# Patient Record
Sex: Female | Born: 1940 | Race: White | Hispanic: No | Marital: Married | Smoking: Never smoker
Health system: Southern US, Community
[De-identification: ages and names within clinical notes are randomized; demographics above are authoritative.]

## PROBLEM LIST (undated history)

## (undated) DIAGNOSIS — M7061 Trochanteric bursitis, right hip: Secondary | ICD-10-CM

## (undated) DIAGNOSIS — M51369 Other intervertebral disc degeneration, lumbar region without mention of lumbar back pain or lower extremity pain: Secondary | ICD-10-CM

## (undated) DIAGNOSIS — G894 Chronic pain syndrome: Secondary | ICD-10-CM

## (undated) DIAGNOSIS — I10 Essential (primary) hypertension: Secondary | ICD-10-CM

## (undated) DIAGNOSIS — M48061 Spinal stenosis, lumbar region without neurogenic claudication: Secondary | ICD-10-CM

## (undated) DIAGNOSIS — M7062 Trochanteric bursitis, left hip: Secondary | ICD-10-CM

## (undated) DIAGNOSIS — M5136 Other intervertebral disc degeneration, lumbar region: Secondary | ICD-10-CM

## (undated) DIAGNOSIS — M47819 Spondylosis without myelopathy or radiculopathy, site unspecified: Secondary | ICD-10-CM

## (undated) DIAGNOSIS — K5090 Crohn's disease, unspecified, without complications: Secondary | ICD-10-CM

## (undated) DIAGNOSIS — M1990 Unspecified osteoarthritis, unspecified site: Secondary | ICD-10-CM

## (undated) HISTORY — DX: Trochanteric bursitis, right hip: M70.61

## (undated) HISTORY — DX: Spondylosis without myelopathy or radiculopathy, site unspecified: M47.819

## (undated) HISTORY — DX: Unspecified osteoarthritis, unspecified site: M19.90

## (undated) HISTORY — DX: Chronic pain syndrome: G89.4

## (undated) HISTORY — DX: Trochanteric bursitis, right hip: M70.62

## (undated) HISTORY — DX: Other intervertebral disc degeneration, lumbar region without mention of lumbar back pain or lower extremity pain: M51.369

## (undated) HISTORY — DX: Other intervertebral disc degeneration, lumbar region: M51.36

## (undated) HISTORY — DX: Spinal stenosis, lumbar region without neurogenic claudication: M48.061

---

## 2020-09-11 ENCOUNTER — Encounter (HOSPITAL_COMMUNITY): Payer: Self-pay | Admitting: Emergency Medicine

## 2020-09-11 ENCOUNTER — Inpatient Hospital Stay (HOSPITAL_COMMUNITY)
Admission: EM | Admit: 2020-09-11 | Discharge: 2020-09-13 | DRG: 690 | Disposition: A | Discharge status: Home / Self Care | Payer: Medicare Other | Attending: Internal Medicine | Admitting: Internal Medicine

## 2020-09-11 DIAGNOSIS — F411 Generalized anxiety disorder: Secondary | ICD-10-CM | POA: Diagnosis present

## 2020-09-11 DIAGNOSIS — N3000 Acute cystitis without hematuria: Principal | ICD-10-CM | POA: Diagnosis present

## 2020-09-11 DIAGNOSIS — R112 Nausea with vomiting, unspecified: Secondary | ICD-10-CM | POA: Diagnosis present

## 2020-09-11 DIAGNOSIS — E039 Hypothyroidism, unspecified: Secondary | ICD-10-CM | POA: Diagnosis present

## 2020-09-11 DIAGNOSIS — E871 Hypo-osmolality and hyponatremia: Secondary | ICD-10-CM | POA: Diagnosis present

## 2020-09-11 DIAGNOSIS — K219 Gastro-esophageal reflux disease without esophagitis: Secondary | ICD-10-CM | POA: Diagnosis present

## 2020-09-11 DIAGNOSIS — G894 Chronic pain syndrome: Secondary | ICD-10-CM | POA: Diagnosis present

## 2020-09-11 DIAGNOSIS — Z79899 Other long term (current) drug therapy: Secondary | ICD-10-CM

## 2020-09-11 DIAGNOSIS — K5900 Constipation, unspecified: Secondary | ICD-10-CM | POA: Diagnosis present

## 2020-09-11 DIAGNOSIS — I10 Essential (primary) hypertension: Secondary | ICD-10-CM | POA: Diagnosis present

## 2020-09-11 DIAGNOSIS — E785 Hyperlipidemia, unspecified: Secondary | ICD-10-CM | POA: Diagnosis present

## 2020-09-11 DIAGNOSIS — M4800 Spinal stenosis, site unspecified: Secondary | ICD-10-CM | POA: Diagnosis present

## 2020-09-11 DIAGNOSIS — Z90710 Acquired absence of both cervix and uterus: Secondary | ICD-10-CM

## 2020-09-11 DIAGNOSIS — Z882 Allergy status to sulfonamides status: Secondary | ICD-10-CM

## 2020-09-11 DIAGNOSIS — E861 Hypovolemia: Secondary | ICD-10-CM | POA: Diagnosis present

## 2020-09-11 DIAGNOSIS — I4510 Unspecified right bundle-branch block: Secondary | ICD-10-CM | POA: Diagnosis present

## 2020-09-11 DIAGNOSIS — R109 Unspecified abdominal pain: Secondary | ICD-10-CM | POA: Diagnosis present

## 2020-09-11 DIAGNOSIS — R531 Weakness: Secondary | ICD-10-CM

## 2020-09-11 DIAGNOSIS — N390 Urinary tract infection, site not specified: Secondary | ICD-10-CM

## 2020-09-11 DIAGNOSIS — Z79890 Hormone replacement therapy (postmenopausal): Secondary | ICD-10-CM

## 2020-09-11 DIAGNOSIS — Z880 Allergy status to penicillin: Secondary | ICD-10-CM

## 2020-09-11 DIAGNOSIS — E860 Dehydration: Secondary | ICD-10-CM | POA: Diagnosis present

## 2020-09-11 DIAGNOSIS — R9431 Abnormal electrocardiogram [ECG] [EKG]: Secondary | ICD-10-CM

## 2020-09-11 DIAGNOSIS — Z20822 Contact with and (suspected) exposure to covid-19: Secondary | ICD-10-CM | POA: Diagnosis present

## 2020-09-11 HISTORY — DX: Essential (primary) hypertension: I10

## 2020-09-11 HISTORY — DX: Crohn's disease, unspecified, without complications: K50.90

## 2020-09-11 MED ORDER — SODIUM CHLORIDE 0.9 % IV SOLN: INTRAVENOUS | Status: DC

## 2020-09-11 MED ORDER — FENTANYL CITRATE (PF) 100 MCG/2ML IJ SOLN
50.0000 ug | Freq: Once | INTRAMUSCULAR | Status: AC
Start: 2020-09-11 — End: 2020-09-11
  Administered 2020-09-11: 50 ug via INTRAVENOUS
  Filled 2020-09-11: qty 2

## 2020-09-11 MED ORDER — HYDRALAZINE HCL 20 MG/ML IJ SOLN
2.0000 mg | Freq: Once | INTRAMUSCULAR | Status: AC
  Administered 2020-09-11: 2 mg via INTRAVENOUS
  Filled 2020-09-11: qty 1

## 2020-09-11 NOTE — ED Provider Notes (Signed)
[facility]-EMERGENCY DEPT Provider Note   CSN: [id number] Arrival date & time: 09/11/20  2300     History Chief Complaint  Patient presents with  . Emesis  . Abdominal Pain  . Weakness    Kathy Fischer is a 80 y.o. female.  The history is provided by the patient and a relative.  Emesis Severity:  Moderate Duration:  2 days Timing:  Sporadic Number of daily episodes:  2 Quality:  Stomach contents Progression:  Unchanged Chronicity:  New Recent urination:  Normal Relieved by:  Nothing Worsened by:  Nothing Ineffective treatments:  None tried Associated symptoms: abdominal pain   Associated symptoms: no arthralgias, no cough, no diarrhea and no fever   Risk factors: no alcohol use   Abdominal Pain Associated symptoms: constipation, nausea and vomiting   Associated symptoms: no cough, no diarrhea and no fever   Weakness Associated symptoms: abdominal pain, nausea and vomiting   Associated symptoms: no arthralgias, no cough, no diarrhea and no fever   nausea and vomiting and weakness and cannot hold down medications. No f/c/r.        Past Medical History:  Diagnosis Date  . Crohn's disease (HCC)   . Hypertension     There are no problems to display for this patient.   History reviewed. No pertinent surgical history.   OB History   No obstetric history on file.     History reviewed. No pertinent family history.     Home Medications Prior to Admission medications   Not on File    Allergies    Penicillins and Sulfa antibiotics  Review of Systems   Review of Systems  Constitutional: Negative for fever.  HENT: Negative for congestion.   Eyes: Negative for visual disturbance.  Respiratory: Negative for cough.   Gastrointestinal: Positive for abdominal pain, constipation, nausea and vomiting. Negative for diarrhea.  Genitourinary: Negative for difficulty urinating.  Musculoskeletal: Negative for arthralgias.  Skin: Negative  for rash.  Neurological: Negative for weakness.  Psychiatric/Behavioral: Negative for agitation.  All other systems reviewed and are negative.   Physical Exam Updated Vital Signs BP (!) 223/107 Comment: rn aware  Pulse 76   Temp 97.7 F (36.5 C) (Oral)   Resp (!) 21   SpO2 100%   Physical Exam Vitals and nursing note reviewed.  Constitutional:      General: She is not in acute distress.    Appearance: Normal appearance.  HENT:     Head: Normocephalic and atraumatic.     Nose: Nose normal.  Eyes:     Conjunctiva/sclera: Conjunctivae normal.     Pupils: Pupils are equal, round, and reactive to light.  Cardiovascular:     Rate and Rhythm: Normal rate and regular rhythm.     Pulses: Normal pulses.     Heart sounds: Normal heart sounds.  Pulmonary:     Effort: Pulmonary effort is normal.     Breath sounds: Normal breath sounds.  Abdominal:     General: Abdomen is flat.     Palpations: Abdomen is soft.     Tenderness: There is abdominal tenderness. There is no guarding or rebound.  Musculoskeletal:        General: Normal range of motion.     Cervical back: Normal range of motion and neck supple.  Skin:    General: Skin is warm and dry.     Capillary Refill: Capillary refill takes less than 2 seconds.  Neurological:     General: No  focal deficit present.     Mental Status: She is alert and oriented to person, place, and time.     Deep Tendon Reflexes: Reflexes normal.  Psychiatric:        Mood and Affect: Mood normal.        Behavior: Behavior normal.     ED Results / Procedures / Treatments   Labs (all labs ordered are listed, but only abnormal results are displayed) Results for orders placed or performed during the hospital encounter of 09/11/20  CBC with Differential/Platelet  Result Value Ref Range   WBC 9.4 4.0 - 10.5 K/uL   RBC 4.14 3.87 - 5.11 MIL/uL   Hemoglobin 12.9 12.0 - 15.0 g/dL   HCT 50.5 39.7 - 67.3 %   MCV 90.8 80.0 - 100.0 fL   MCH 31.2 26.0 -  34.0 pg   MCHC 34.3 30.0 - 36.0 g/dL   RDW 41.9 37.9 - 02.4 %   Platelets 333 150 - 400 K/uL   nRBC 0.0 0.0 - 0.2 %   Neutrophils Relative % 84 %   Neutro Abs 7.9 (H) 1.7 - 7.7 K/uL   Lymphocytes Relative 12 %   Lymphs Abs 1.1 0.7 - 4.0 K/uL   Monocytes Relative 3 %   Monocytes Absolute 0.3 0.1 - 1.0 K/uL   Eosinophils Relative 0 %   Eosinophils Absolute 0.0 0.0 - 0.5 K/uL   Basophils Relative 0 %   Basophils Absolute 0.0 0.0 - 0.1 K/uL   Immature Granulocytes 1 %   Abs Immature Granulocytes 0.11 (H) 0.00 - 0.07 K/uL  Comprehensive metabolic panel  Result Value Ref Range   Sodium 126 (L) 135 - 145 mmol/L   Potassium 4.7 3.5 - 5.1 mmol/L   Chloride 97 (L) 98 - 111 mmol/L   CO2 17 (L) 22 - 32 mmol/L   Glucose, Bld 122 (H) 70 - 99 mg/dL   BUN 20 8 - 23 mg/dL   Creatinine, Ser 0.97 (H) 0.44 - 1.00 mg/dL   Calcium 9.7 8.9 - 35.3 mg/dL   Total Protein 8.5 (H) 6.5 - 8.1 g/dL   Albumin 4.5 3.5 - 5.0 g/dL   AST 24 15 - 41 U/L   ALT 15 0 - 44 U/L   Alkaline Phosphatase 89 38 - 126 U/L   Total Bilirubin 0.5 0.3 - 1.2 mg/dL   GFR, Estimated 56 (L) >60 mL/min   Anion gap 12 5 - 15  Urinalysis, Routine w reflex microscopic Urine, Catheterized  Result Value Ref Range   Color, Urine YELLOW YELLOW   APPearance CLEAR CLEAR   Specific Gravity, Urine 1.011 1.005 - 1.030   pH 6.0 5.0 - 8.0   Glucose, UA NEGATIVE NEGATIVE mg/dL   Hgb urine dipstick SMALL (A) NEGATIVE   Bilirubin Urine NEGATIVE NEGATIVE   Ketones, ur 5 (A) NEGATIVE mg/dL   Protein, ur 299 (A) NEGATIVE mg/dL   Nitrite NEGATIVE NEGATIVE   Leukocytes,Ua MODERATE (A) NEGATIVE   RBC / HPF 0-5 0 - 5 RBC/hpf   WBC, UA >50 (H) 0 - 5 WBC/hpf   Bacteria, UA FEW (A) NONE SEEN  Troponin I (High Sensitivity)  Result Value Ref Range   Troponin I (High Sensitivity) 4 <18 ng/L   DG Chest Portable 1 View  Result Date: 09/12/2020 CLINICAL DATA:  Nausea and vomiting and weakness EXAM: PORTABLE CHEST 1 VIEW COMPARISON:  None. FINDINGS:  The heart size and mediastinal contours are within normal limits. Both lungs are clear. The visualized  skeletal structures are unremarkable. IMPRESSION: No active disease. Electronically Signed   By: Alcide Clever M.D.   On: 09/12/2020 01:43   CT Renal Stone Study  Result Date: 09/12/2020 CLINICAL DATA:  Flank pain EXAM: CT ABDOMEN AND PELVIS WITHOUT CONTRAST TECHNIQUE: Multidetector CT imaging of the abdomen and pelvis was performed following the standard protocol without IV contrast. COMPARISON:  None. FINDINGS: Lower chest: No acute abnormality. Hepatobiliary: Liver demonstrates multiple cysts. The largest of these is noted centrally measuring 5.3 cm. Gallbladder is not well visualized. Question prior cholecystectomy Pancreas: Unremarkable. No pancreatic ductal dilatation or surrounding inflammatory changes. Spleen: Normal in size without focal abnormality. Adrenals/Urinary Tract: Adrenal glands are within normal limits. Kidneys are well visualized bilaterally. Extrarenal pelves are seen bilaterally without obstructive change. The ureters are within normal limits. Bladder is partially distended. Stomach/Bowel: Changes of mild colonic constipation are noted. No obstructive changes are seen. The appendix is not well visualized although no inflammatory changes are noted. Small bowel is unremarkable. Stomach demonstrates a moderate-sized sliding-type hiatal hernia. Vascular/Lymphatic: Aortic atherosclerosis. No enlarged abdominal or pelvic lymph nodes. Reproductive: Status post hysterectomy. No adnexal masses. Other: No abdominal wall hernia or abnormality. No abdominopelvic ascites. Musculoskeletal: No acute or significant osseous findings. IMPRESSION: Hepatic cysts. Mild colonic constipation. Moderate-sized sliding-type hiatal hernia. Electronically Signed   By: Alcide Clever M.D.   On: 09/12/2020 00:32    Radiology No results found.  Procedures Procedures   Medications Ordered in ED Medications  0.9 %   sodium chloride infusion ( Intravenous New Bag/Given 09/11/20 2342)  promethazine (PHENERGAN) 12.5 mg in sodium chloride 0.9 % 50 mL IVPB (has no administration in time range)  hydrALAZINE (APRESOLINE) injection 2 mg (2 mg Intravenous Given 09/11/20 2345)  fentaNYL (SUBLIMAZE) injection 50 mcg (50 mcg Intravenous Given 09/11/20 2347)  ketorolac (TORADOL) 30 MG/ML injection 30 mg (30 mg Intravenous Given 09/12/20 0107)  hydrALAZINE (APRESOLINE) injection 5 mg (5 mg Intravenous Given 09/12/20 0108)    ED Course  I have reviewed the triage vital signs and the nursing notes.  Pertinent labs & imaging results that were available during my care of the patient were reviewed by me and considered in my medical decision making (see chart for details).     Final Clinical Impression(s) / ED Diagnoses Admit to medicine    Andres Escandon, MD 09/12/20 [id number]

## 2020-09-11 NOTE — ED Triage Notes (Signed)
Pt BIB EMS from home c/o N/D x 24 hours. Sx worsened in the last 3 hours. Also clo abdominal pain and weakness. Hx of Crohn's disease.

## 2020-09-12 ENCOUNTER — Emergency Department (HOSPITAL_COMMUNITY): Payer: Medicare Other

## 2020-09-12 ENCOUNTER — Encounter (HOSPITAL_COMMUNITY): Payer: Self-pay | Admitting: Emergency Medicine

## 2020-09-12 ENCOUNTER — Other Ambulatory Visit: Payer: Self-pay

## 2020-09-12 DIAGNOSIS — K219 Gastro-esophageal reflux disease without esophagitis: Secondary | ICD-10-CM | POA: Diagnosis present

## 2020-09-12 DIAGNOSIS — E860 Dehydration: Secondary | ICD-10-CM | POA: Diagnosis present

## 2020-09-12 DIAGNOSIS — Z880 Allergy status to penicillin: Secondary | ICD-10-CM | POA: Diagnosis not present

## 2020-09-12 DIAGNOSIS — E871 Hypo-osmolality and hyponatremia: Secondary | ICD-10-CM | POA: Diagnosis present

## 2020-09-12 DIAGNOSIS — R531 Weakness: Secondary | ICD-10-CM

## 2020-09-12 DIAGNOSIS — K5900 Constipation, unspecified: Secondary | ICD-10-CM | POA: Diagnosis present

## 2020-09-12 DIAGNOSIS — N3000 Acute cystitis without hematuria: Principal | ICD-10-CM

## 2020-09-12 DIAGNOSIS — Z20822 Contact with and (suspected) exposure to covid-19: Secondary | ICD-10-CM | POA: Diagnosis present

## 2020-09-12 DIAGNOSIS — E785 Hyperlipidemia, unspecified: Secondary | ICD-10-CM | POA: Diagnosis present

## 2020-09-12 DIAGNOSIS — I10 Essential (primary) hypertension: Secondary | ICD-10-CM | POA: Diagnosis not present

## 2020-09-12 DIAGNOSIS — Z882 Allergy status to sulfonamides status: Secondary | ICD-10-CM | POA: Diagnosis not present

## 2020-09-12 DIAGNOSIS — Z79890 Hormone replacement therapy (postmenopausal): Secondary | ICD-10-CM | POA: Diagnosis not present

## 2020-09-12 DIAGNOSIS — Z79899 Other long term (current) drug therapy: Secondary | ICD-10-CM | POA: Diagnosis not present

## 2020-09-12 DIAGNOSIS — M4800 Spinal stenosis, site unspecified: Secondary | ICD-10-CM | POA: Diagnosis present

## 2020-09-12 DIAGNOSIS — F411 Generalized anxiety disorder: Secondary | ICD-10-CM | POA: Diagnosis present

## 2020-09-12 DIAGNOSIS — E861 Hypovolemia: Secondary | ICD-10-CM | POA: Diagnosis present

## 2020-09-12 DIAGNOSIS — G894 Chronic pain syndrome: Secondary | ICD-10-CM | POA: Diagnosis present

## 2020-09-12 DIAGNOSIS — I4510 Unspecified right bundle-branch block: Secondary | ICD-10-CM | POA: Diagnosis present

## 2020-09-12 DIAGNOSIS — E039 Hypothyroidism, unspecified: Secondary | ICD-10-CM | POA: Diagnosis present

## 2020-09-12 DIAGNOSIS — R112 Nausea with vomiting, unspecified: Secondary | ICD-10-CM

## 2020-09-12 DIAGNOSIS — R9431 Abnormal electrocardiogram [ECG] [EKG]: Secondary | ICD-10-CM

## 2020-09-12 DIAGNOSIS — R109 Unspecified abdominal pain: Secondary | ICD-10-CM

## 2020-09-12 DIAGNOSIS — Z90710 Acquired absence of both cervix and uterus: Secondary | ICD-10-CM | POA: Diagnosis not present

## 2020-09-12 LAB — RESP PANEL BY RT-PCR (FLU A&B, COVID) ARPGX2
Influenza A by PCR: NEGATIVE
Influenza B by PCR: NEGATIVE
SARS Coronavirus 2 by RT PCR: NEGATIVE

## 2020-09-12 LAB — CBC WITH DIFFERENTIAL/PLATELET
Abs Immature Granulocytes: 0.11 10*3/uL — ABNORMAL HIGH (ref 0.00–0.07)
Abs Immature Granulocytes: 0.06 10*3/uL (ref 0.00–0.07)
Basophils Absolute: 0 10*3/uL (ref 0.0–0.1)
Basophils Absolute: 0.1 10*3/uL (ref 0.0–0.1)
Basophils Relative: 0 %
Basophils Relative: 1 %
Eosinophils Absolute: 0 10*3/uL (ref 0.0–0.5)
Eosinophils Absolute: 0 10*3/uL (ref 0.0–0.5)
Eosinophils Relative: 0 %
Eosinophils Relative: 0 %
HCT: 37.6 % (ref 36.0–46.0)
HCT: 37.9 % (ref 36.0–46.0)
Hemoglobin: 12.9 g/dL (ref 12.0–15.0)
Hemoglobin: 12.8 g/dL (ref 12.0–15.0)
Immature Granulocytes: 1 %
Immature Granulocytes: 1 %
Lymphocytes Relative: 12 %
Lymphocytes Relative: 19 %
Lymphs Abs: 1.1 10*3/uL (ref 0.7–4.0)
Lymphs Abs: 2 10*3/uL (ref 0.7–4.0)
MCH: 31.2 pg (ref 26.0–34.0)
MCH: 31.3 pg (ref 26.0–34.0)
MCHC: 34.3 g/dL (ref 30.0–36.0)
MCHC: 33.8 g/dL (ref 30.0–36.0)
MCV: 90.8 fL (ref 80.0–100.0)
MCV: 92.7 fL (ref 80.0–100.0)
Monocytes Absolute: 0.3 10*3/uL (ref 0.1–1.0)
Monocytes Absolute: 0.7 10*3/uL (ref 0.1–1.0)
Monocytes Relative: 3 %
Monocytes Relative: 7 %
Neutro Abs: 7.9 10*3/uL — ABNORMAL HIGH (ref 1.7–7.7)
Neutro Abs: 7.6 10*3/uL (ref 1.7–7.7)
Neutrophils Relative %: 84 %
Neutrophils Relative %: 72 %
Platelets: 333 10*3/uL (ref 150–400)
Platelets: 314 10*3/uL (ref 150–400)
RBC: 4.14 MIL/uL (ref 3.87–5.11)
RBC: 4.09 MIL/uL (ref 3.87–5.11)
RDW: 12.1 % (ref 11.5–15.5)
RDW: 12.4 % (ref 11.5–15.5)
WBC: 9.4 10*3/uL (ref 4.0–10.5)
WBC: 10.4 10*3/uL (ref 4.0–10.5)
nRBC: 0 % (ref 0.0–0.2)
nRBC: 0 % (ref 0.0–0.2)

## 2020-09-12 LAB — URINALYSIS, ROUTINE W REFLEX MICROSCOPIC
Bilirubin Urine: NEGATIVE
Glucose, UA: NEGATIVE mg/dL
Ketones, ur: 5 mg/dL — AB
Nitrite: NEGATIVE
Protein, ur: 100 mg/dL — AB
Specific Gravity, Urine: 1.011 (ref 1.005–1.030)
WBC, UA: >50 WBC/hpf — ABNORMAL HIGH (ref 0–5)
pH: 6 (ref 5.0–8.0)

## 2020-09-12 LAB — SODIUM, URINE, RANDOM: Sodium, Ur: 67 mmol/L

## 2020-09-12 LAB — COMPREHENSIVE METABOLIC PANEL
ALT: 15 U/L (ref 0–44)
ALT: 17 U/L (ref 0–44)
AST: 24 U/L (ref 15–41)
AST: 27 U/L (ref 15–41)
Albumin: 4.5 g/dL (ref 3.5–5.0)
Albumin: 4 g/dL (ref 3.5–5.0)
Alkaline Phosphatase: 89 U/L (ref 38–126)
Alkaline Phosphatase: 77 U/L (ref 38–126)
Anion gap: 12 (ref 5–15)
Anion gap: 14 (ref 5–15)
BUN: 20 mg/dL (ref 8–23)
BUN: 17 mg/dL (ref 8–23)
CO2: 17 mmol/L — ABNORMAL LOW (ref 22–32)
CO2: 15 mmol/L — ABNORMAL LOW (ref 22–32)
Calcium: 9.7 mg/dL (ref 8.9–10.3)
Calcium: 9.4 mg/dL (ref 8.9–10.3)
Chloride: 97 mmol/L — ABNORMAL LOW (ref 98–111)
Chloride: 105 mmol/L (ref 98–111)
Creatinine, Ser: 1.01 mg/dL — ABNORMAL HIGH (ref 0.44–1.00)
Creatinine, Ser: 0.9 mg/dL (ref 0.44–1.00)
GFR, Estimated: 56 mL/min — ABNORMAL LOW (ref >60)
GFR, Estimated: >60 mL/min (ref >60)
Glucose, Bld: 122 mg/dL — ABNORMAL HIGH (ref 70–99)
Glucose, Bld: 107 mg/dL — ABNORMAL HIGH (ref 70–99)
Potassium: 4.7 mmol/L (ref 3.5–5.1)
Potassium: 4 mmol/L (ref 3.5–5.1)
Sodium: 126 mmol/L — ABNORMAL LOW (ref 135–145)
Sodium: 134 mmol/L — ABNORMAL LOW (ref 135–145)
Total Bilirubin: 0.5 mg/dL (ref 0.3–1.2)
Total Bilirubin: 0.8 mg/dL (ref 0.3–1.2)
Total Protein: 8.5 g/dL — ABNORMAL HIGH (ref 6.5–8.1)
Total Protein: 7.4 g/dL (ref 6.5–8.1)

## 2020-09-12 LAB — TROPONIN I (HIGH SENSITIVITY)
Troponin I (High Sensitivity): 4 ng/L (ref <18)
Troponin I (High Sensitivity): 8 ng/L (ref <18)

## 2020-09-12 LAB — OSMOLALITY: Osmolality: 279 mOsm/kg (ref 275–295)

## 2020-09-12 LAB — MAGNESIUM: Magnesium: 1.9 mg/dL (ref 1.7–2.4)

## 2020-09-12 LAB — OSMOLALITY, URINE: Osmolality, Ur: 383 mOsm/kg (ref 300–900)

## 2020-09-12 LAB — TSH: TSH: 3.575 u[IU]/mL (ref 0.350–4.500)

## 2020-09-12 MED ORDER — ENOXAPARIN SODIUM 40 MG/0.4ML ~~LOC~~ SOLN
40.0000 mg | SUBCUTANEOUS | Status: DC
  Administered 2020-09-12 – 2020-09-13 (×2): 40 mg via SUBCUTANEOUS
  Filled 2020-09-12 (×2): qty 0.4

## 2020-09-12 MED ORDER — SODIUM CHLORIDE 0.9 % IV SOLN
1.0000 g | Freq: Once | INTRAVENOUS | Status: AC
  Administered 2020-09-12: 1 g via INTRAVENOUS
  Filled 2020-09-12: qty 1

## 2020-09-12 MED ORDER — HYDRALAZINE HCL 20 MG/ML IJ SOLN
10.0000 mg | INTRAMUSCULAR | Status: DC | PRN
  Administered 2020-09-12: 10 mg via INTRAVENOUS
  Filled 2020-09-12: qty 1

## 2020-09-12 MED ORDER — LORAZEPAM 2 MG/ML IJ SOLN
0.5000 mg | INTRAMUSCULAR | Status: DC | PRN
  Administered 2020-09-12 (×2): 0.5 mg via INTRAVENOUS
  Filled 2020-09-12 (×2): qty 1

## 2020-09-12 MED ORDER — GABAPENTIN 300 MG PO CAPS
300.0000 mg | ORAL_CAPSULE | Freq: Four times a day (QID) | ORAL | Status: DC
  Administered 2020-09-12 – 2020-09-13 (×4): 300 mg via ORAL
  Filled 2020-09-12 (×4): qty 1

## 2020-09-12 MED ORDER — HYDROMORPHONE HCL 1 MG/ML IJ SOLN
1.0000 mg | INTRAMUSCULAR | Status: DC | PRN
  Administered 2020-09-12 (×2): 1 mg via INTRAVENOUS
  Filled 2020-09-12 (×2): qty 1

## 2020-09-12 MED ORDER — FENTANYL CITRATE (PF) 100 MCG/2ML IJ SOLN: 50.0000 ug | Freq: Once | INTRAMUSCULAR | Status: DC

## 2020-09-12 MED ORDER — BUPROPION HCL ER (XL) 150 MG PO TB24
150.0000 mg | ORAL_TABLET | Freq: Every day | ORAL | Status: DC
  Administered 2020-09-12 – 2020-09-13 (×2): 150 mg via ORAL
  Filled 2020-09-12 (×2): qty 1

## 2020-09-12 MED ORDER — KETOROLAC TROMETHAMINE 30 MG/ML IJ SOLN
30.0000 mg | Freq: Once | INTRAMUSCULAR | Status: AC
  Administered 2020-09-12: 30 mg via INTRAVENOUS
  Filled 2020-09-12: qty 1

## 2020-09-12 MED ORDER — LIP MEDEX EX OINT: TOPICAL_OINTMENT | CUTANEOUS | Status: DC | PRN

## 2020-09-12 MED ORDER — SODIUM CHLORIDE 0.9 % IV SOLN
1.0000 g | Freq: Once | INTRAVENOUS | Status: DC
  Filled 2020-09-12: qty 1

## 2020-09-12 MED ORDER — HYDRALAZINE HCL 20 MG/ML IJ SOLN
5.0000 mg | Freq: Once | INTRAMUSCULAR | Status: AC
  Administered 2020-09-12: 5 mg via INTRAVENOUS
  Filled 2020-09-12: qty 1

## 2020-09-12 MED ORDER — SODIUM CHLORIDE 0.9 % IV SOLN: INTRAVENOUS | Status: DC

## 2020-09-12 MED ORDER — SODIUM CHLORIDE 0.9 % IV SOLN
1.0000 g | INTRAVENOUS | Status: DC
  Administered 2020-09-12: 1 g via INTRAVENOUS
  Filled 2020-09-12: qty 1
  Filled 2020-09-12: qty 10

## 2020-09-12 MED ORDER — DULOXETINE HCL 60 MG PO CPEP
60.0000 mg | ORAL_CAPSULE | Freq: Every day | ORAL | Status: DC
  Administered 2020-09-12 – 2020-09-13 (×2): 60 mg via ORAL
  Filled 2020-09-12 (×2): qty 1

## 2020-09-12 MED ORDER — DICYCLOMINE HCL 20 MG PO TABS
20.0000 mg | ORAL_TABLET | Freq: Three times a day (TID) | ORAL | Status: DC
  Administered 2020-09-12 – 2020-09-13 (×5): 20 mg via ORAL
  Filled 2020-09-12 (×6): qty 1

## 2020-09-12 MED ORDER — SODIUM CHLORIDE 0.9 % IV SOLN
2.0000 g | Freq: Once | INTRAVENOUS | Status: DC
  Filled 2020-09-12: qty 2

## 2020-09-12 MED ORDER — LEVOTHYROXINE SODIUM 75 MCG PO TABS
75.0000 ug | ORAL_TABLET | Freq: Every day | ORAL | Status: DC
  Administered 2020-09-12 – 2020-09-13 (×2): 75 ug via ORAL
  Filled 2020-09-12 (×4): qty 1

## 2020-09-12 MED ORDER — CARISOPRODOL 350 MG PO TABS
350.0000 mg | ORAL_TABLET | Freq: Three times a day (TID) | ORAL | Status: DC
  Administered 2020-09-12 – 2020-09-13 (×3): 350 mg via ORAL
  Filled 2020-09-12 (×3): qty 1

## 2020-09-12 MED ORDER — SODIUM CHLORIDE 0.9 % IV SOLN
12.5000 mg | Freq: Four times a day (QID) | INTRAVENOUS | Status: DC | PRN
  Administered 2020-09-12: 12.5 mg via INTRAVENOUS
  Filled 2020-09-12: qty 12.5

## 2020-09-12 MED ORDER — ACETAMINOPHEN 325 MG PO TABS
650.0000 mg | ORAL_TABLET | Freq: Four times a day (QID) | ORAL | Status: DC | PRN
  Administered 2020-09-12 – 2020-09-13 (×3): 650 mg via ORAL
  Filled 2020-09-12 (×4): qty 2

## 2020-09-12 MED ORDER — ACETAMINOPHEN 650 MG RE SUPP: 650.0000 mg | Freq: Four times a day (QID) | RECTAL | Status: DC | PRN

## 2020-09-12 MED ORDER — PANTOPRAZOLE SODIUM 40 MG IV SOLR
40.0000 mg | INTRAVENOUS | Status: DC
  Administered 2020-09-12 – 2020-09-13 (×2): 40 mg via INTRAVENOUS
  Filled 2020-09-12 (×2): qty 40

## 2020-09-12 MED ORDER — ZOLPIDEM TARTRATE 5 MG PO TABS
5.0000 mg | ORAL_TABLET | Freq: Once | ORAL | Status: AC
  Administered 2020-09-12: 5 mg via ORAL
  Filled 2020-09-12: qty 1

## 2020-09-12 MED ORDER — SODIUM CHLORIDE 0.9 % IV SOLN
1.0000 g | Freq: Three times a day (TID) | INTRAVENOUS | Status: DC
  Administered 2020-09-12: 1 g via INTRAVENOUS
  Filled 2020-09-12 (×4): qty 1

## 2020-09-12 NOTE — H&P (Signed)
History and Physical    PLEASE NOTE THAT DRAGON DICTATION SOFTWARE WAS USED IN THE CONSTRUCTION OF THIS NOTE.   Kathy Fischer UEA:[id number] DOB: [birth year] DOA: 09/11/2020  PCP: Renelda Loma., MD Patient coming from: home   I have personally briefly reviewed patient's old medical records in [facility]  Chief Complaint: Nausea vomiting  HPI: Kathy Fischer is a 80 y.o. female with medical history significant for spinal stenosis, chronic pain syndrome, hypertension, generalized anxiety disorder, acquired hypothyroidism, GERD, hyperlipidemia, who is admitted to [facility] on 09/11/2020 with acute cystitis after presenting from home to [facility] ED complaining of nausea/vomiting.   The following history is provided by the patient as well as her husband, who was present at bedside, in addition to my discussions with the emergency department physician and via chart review. The patient reports 2 to 3 days of intermittent nausea resulting in a total of approximately 10 episodes of nonbloody, nonbilious emesis over that timeframe, with most recent episode occurring in the emergency department today.  She also reports 2 to 3 days of dysuria in the absence of gross hematuria or change in urinary urgency/frequency.  She also notes associated generalized weakness in the absence of any acute focal weakness.  She denies any subjective fever, chills, rigors, or generalized myalgias.  She reports sharp suprapubic abdominal discomfort over the last 2 days, which she reports that she has experienced previously when she has been diagnosed with urinary tract infections.  She reports that this is worse with palpation over the area.  Denies any preceding trauma.  Denies any diarrhea, rash, melena, or hematochezia.  She denies any recent neck stiffness, rhinitis, rhinorrhea, sore throat, shortness of breath, no wheezing, cough.  No recent traveling or known COVID-19 exposures.  Denies any associated  chest pain, diaphoresis, or palpitations.  History is notable for chronic pain syndrome in the setting of spinal stenosis.  She actively works with pain management on an outpatient basis, and reports that she has prescribed a every 72 hour fentanyl patch as well as daily oral Dilaudid.  In spite of these measures, she reports breakthrough suprapubic discomfort over the last few days, as detailed above.  In the setting of intermittent nausea/vomiting over the last 2 days, she reports that she has been limited in her ability to tolerate her oral medications over that timeframe, including that of her home antihypertensive medications.     ED Course:  Vital signs in the ED were notable for the following: Temperature max 97.7, heart rate 72-80; initial blood pressure 212/98, which decreased to 185/81 following dose of IV fentanyl as well as 2 doses of IV hydralazine.  Respiratory rate 14-25, oxygen saturation 97 to 100% on room air.  Labs were notable for the following: CMP notable for the following: Sodium 126, with no prior data point available for point comparison, potassium 4.7, chloride 97, bicarbonate 17, anion gap 12, creatinine 1.01, and liver enzymes were found to be within normal limits.  CBC notable for white blood cell count 9400.  Urinalysis showed greater than 50 white blood cells with moderate leukocyte Estrace.  Nasopharyngeal COVID-19 PCR was performed in the ED this evening, with result currently pending.  EKG showed sinus rhythm with right bundle branch block, heart rate 75, QRS 146, QTc 508, nonspecific T wave flattening in lead III, and nonspecific to inversion in leads V1 through V3, no prior EKG available for point comparison, and showed no evidence of ST changes, including no evidence  of ST elevation.  Chest x-ray showed no evidence of acute cardiopulmonary process, and CT abdomen/pelvis showed mild colonic constipation, but otherwise, no evidence of acute intra-abdominal  process.  While in the ED, the following were administered: Fentanyl 50 mcg IV x1, hydralazine 2 mg IV x2, Toradol 30 mg IV x1, 1 dose of aztreonam in the setting of reported allergy to penicillin, and the patient was started on continuous normal saline at 125 cc/h.       Review of Systems: As per HPI otherwise 10 point review of systems negative.   Past Medical History:  Diagnosis Date  . Crohn's disease (HCC)   . Hypertension     History reviewed. No pertinent surgical history.  Social History:  has no history on file for tobacco use, alcohol use, and drug use.   Allergies  Allergen Reactions  . Dexamethasone Hypertension  . Penicillins   . Sulfa Antibiotics     History reviewed. No pertinent family history.   Prior to Admission medications   Medication Sig Start Date End Date Taking? Authorizing Provider  buprenorphine (BUTRANS) 5 MCG/HR PTWK Place 1 patch onto the skin every 7 (seven) days. 10/02/20 10/30/20 Yes [provider]  buPROPion (WELLBUTRIN XL) 150 MG 24 hr tablet Take 1 tablet by mouth every morning. 05/02/20  Yes [provider]  carisoprodol (SOMA) 350 MG tablet Take 350 mg by mouth 3 (three) times daily. 11/01/17  Yes [provider]  Cholecalciferol 1.25 MG (50000 UT) capsule Take 1 capsule by mouth 2 (two) times a week. 03/22/18  Yes [provider]  dicyclomine (BENTYL) 20 MG tablet Take 1 tablet by mouth every 6 (six) hours as needed for cramping. 03/31/20  Yes [provider]  DULoxetine (CYMBALTA) 60 MG capsule Take 1 capsule by mouth daily. 08/04/20  Yes [provider]  gabapentin (NEURONTIN) 300 MG capsule Take 1 capsule by mouth 4 (four) times daily. 05/21/20  Yes [provider]  HYDROmorphone (DILAUDID) 2 MG tablet Take 2 mg by mouth 2 (two) times daily. 09/02/20 11/01/20 Yes [provider]  hydrOXYzine (ATARAX/VISTARIL) 25 MG tablet Take 1 tablet by mouth daily as needed. May take  up to 3 to 4 times daily 04/16/20  Yes [provider]  levothyroxine (SYNTHROID) 75 MCG tablet Take 75 mcg by mouth daily. 01/15/20  Yes [provider]  lisinopril (ZESTRIL) 20 MG tablet Take 20 mg by mouth daily. 07/02/20  Yes [provider]  meclizine (ANTIVERT) 25 MG tablet Take 25 mg by mouth 3 (three) times daily as needed. 12/06/19 12/05/20 Yes [provider]  naloxone (NARCAN) nasal spray 4 mg/0.1 mL Place 1 spray into the nose once as needed. one spray by Nasal route once as needed for up to 1 dose. Use per instruction in event of accidential over dose and call 911. 08/13/19  Yes [provider]  omeprazole (PRILOSEC) 40 MG capsule Take 1 capsule by mouth daily. 05/12/20  Yes [provider]  prochlorperazine (COMPAZINE) 5 MG tablet Take 1 tablet by mouth every 6 (six) hours as needed. 03/31/20  Yes [provider]  rosuvastatin (CRESTOR) 20 MG tablet Take 20 mg by mouth daily. 08/04/20  Yes [provider]  zolpidem (AMBIEN) 10 MG tablet Take 10 tablets by mouth at bedtime as needed. 05/20/19  Yes [provider]     Objective    Physical Exam: Vitals:   09/12/20 0108 09/12/20 0115 09/12/20 0130 09/12/20 0209  BP: (!) 209/98  Marland Kitchen)  185/81   Pulse:  78 80   Resp:  14 (!) 27   Temp:      TempSrc:      SpO2:  100% 97%   Weight:    68 kg  Height:    5' 3.5" (1.613 m)    General: appears to be stated age; alert, oriented Skin: warm, dry, no rash Head:  AT/[place] Mouth:  Oral mucosa membranes appear dry, normal dentition Neck: supple; trachea midline Heart:  RRR; did not appreciate any M/R/G Lungs: CTAB, did not appreciate any wheezes, rales, or rhonchi Abdomen: + BS; soft, ND; mild tenderness to palpation over the bilateral lower abdominal quadrants in the absence of any associated guarding, rigidity, or rebound tenderness Vascular: 2+ pedal pulses b/l; 2+ radial pulses b/l Extremities: no peripheral  edema, no muscle wasting Neuro: strength and sensation intact in upper and lower extremities b/l     Labs on Admission: I have personally reviewed following labs and imaging studies  CBC: Recent Labs  Lab 09/11/20 2303  WBC 9.4  NEUTROABS 7.9*  HGB 12.9  HCT 37.6  MCV 90.8  PLT 333   Basic Metabolic Panel: Recent Labs  Lab 09/11/20 2303  NA 126*  K 4.7  CL 97*  CO2 17*  GLUCOSE 122*  BUN 20  CREATININE 1.01*  CALCIUM 9.7   GFR: Estimated Creatinine Clearance: 41.7 mL/min (A) (by C-G formula based on SCr of 1.01 mg/dL (H)). Liver Function Tests: Recent Labs  Lab 09/11/20 2303  AST 24  ALT 15  ALKPHOS 89  BILITOT 0.5  PROT 8.5*  ALBUMIN 4.5   No results for input(s): LIPASE, AMYLASE in the last 168 hours. No results for input(s): AMMONIA in the last 168 hours. Coagulation Profile: No results for input(s): INR, PROTIME in the last 168 hours. Cardiac Enzymes: No results for input(s): CKTOTAL, CKMB, CKMBINDEX, TROPONINI in the last 168 hours. BNP (last 3 results) No results for input(s): PROBNP in the last 8760 hours. HbA1C: No results for input(s): HGBA1C in the last 72 hours. CBG: No results for input(s): GLUCAP in the last 168 hours. Lipid Profile: No results for input(s): CHOL, HDL, LDLCALC, TRIG, CHOLHDL, LDLDIRECT in the last 72 hours. Thyroid Function Tests: No results for input(s): TSH, T4TOTAL, FREET4, T3FREE, THYROIDAB in the last 72 hours. Anemia Panel: No results for input(s): VITAMINB12, FOLATE, FERRITIN, TIBC, IRON, RETICCTPCT in the last 72 hours. Urine analysis:    Component Value Date/Time   COLORURINE YELLOW 09/11/2020 2303   APPEARANCEUR CLEAR 09/11/2020 2303   LABSPEC 1.011 09/11/2020 2303   PHURINE 6.0 09/11/2020 2303   GLUCOSEU NEGATIVE 09/11/2020 2303   HGBUR SMALL (A) 09/11/2020 2303   BILIRUBINUR NEGATIVE 09/11/2020 2303   KETONESUR 5 (A) 09/11/2020 2303   PROTEINUR 100 (A) 09/11/2020 2303   NITRITE NEGATIVE 09/11/2020  2303   LEUKOCYTESUR MODERATE (A) 09/11/2020 2303    Radiological Exams on Admission: DG Chest Portable 1 View  Result Date: 09/12/2020 CLINICAL DATA:  Nausea and vomiting and weakness EXAM: PORTABLE CHEST 1 VIEW COMPARISON:  None. FINDINGS: The heart size and mediastinal contours are within normal limits. Both lungs are clear. The visualized skeletal structures are unremarkable. IMPRESSION: No active disease. Electronically Signed   By: Alcide CleverMark  Lukens M.D.   On: 09/12/2020 01:43   CT Renal Stone Study  Result Date: 09/12/2020 CLINICAL DATA:  Flank pain EXAM: CT ABDOMEN AND PELVIS WITHOUT CONTRAST TECHNIQUE: Multidetector CT imaging of the abdomen and pelvis was performed following the standard  protocol without IV contrast. COMPARISON:  None. FINDINGS: Lower chest: No acute abnormality. Hepatobiliary: Liver demonstrates multiple cysts. The largest of these is noted centrally measuring 5.3 cm. Gallbladder is not well visualized. Question prior cholecystectomy Pancreas: Unremarkable. No pancreatic ductal dilatation or surrounding inflammatory changes. Spleen: Normal in size without focal abnormality. Adrenals/Urinary Tract: Adrenal glands are within normal limits. Kidneys are well visualized bilaterally. Extrarenal pelves are seen bilaterally without obstructive change. The ureters are within normal limits. Bladder is partially distended. Stomach/Bowel: Changes of mild colonic constipation are noted. No obstructive changes are seen. The appendix is not well visualized although no inflammatory changes are noted. Small bowel is unremarkable. Stomach demonstrates a moderate-sized sliding-type hiatal hernia. Vascular/Lymphatic: Aortic atherosclerosis. No enlarged abdominal or pelvic lymph nodes. Reproductive: Status post hysterectomy. No adnexal masses. Other: No abdominal wall hernia or abnormality. No abdominopelvic ascites. Musculoskeletal: No acute or significant osseous findings. IMPRESSION: Hepatic cysts.  Mild colonic constipation. Moderate-sized sliding-type hiatal hernia. Electronically Signed   By: Alcide Clever M.D.   On: 09/12/2020 00:32     EKG: Independently reviewed, with result as described above.    Assessment/Plan   Cosima Prentiss is a 80 y.o. female with medical history significant for spinal stenosis, chronic pain syndrome, hypertension, generalized anxiety disorder, acquired hypothyroidism, GERD, hyperlipidemia, who is admitted to [facility] on 09/11/2020 with acute cystitis after presenting from home to [facility] ED complaining of nausea/vomiting.     Principal Problem:   Acute cystitis Active Problems:   Nausea & vomiting   Generalized weakness   Abdominal pain   Hyponatremia   Hypertension   Acquired hypothyroidism    #) Acute cystitis: Diagnosis on the basis of 2 to 3 days of dysuria, suprapubic discomfort, nausea, vomiting, and generalized weakness, with presenting urinalysis consistent with significant pyuria suggestive of underlying urinary tract infection.  Of note, does not meet SIRS criteria at this time for sepsis.  In the setting of reported allergy to penicillin, she was started on aztreonam in the ED today, which we will continue.  Plan: Continue aztreonam, as above.  Check blood cultures x2.  Add on urine culture to urine specimen collected earlier today in the ED.  Repeat CBC with differential in the morning.  Further evaluation management nausea/vomiting as well as generalized weakness, as further detailed below.      #) Intractable nausea/vomiting: Patient reports approximately 10 episodes of nausea resulting in nonbloody, nonbilious emesis over the last 2 to 3 days, with most recent episode occurring in the ED tonight.  Options for pharmacologic antiemetics is somewhat limited by the patient's QTC prolongation.  Consequently, we will proceed with as needed IV Ativan.  Suspect patient's nausea/vomiting is on the basis of her urinary tract infection,  as she conveys that she has experienced this symptom with prior bouts of urinary tract infection.  We will proceed with gentle IV fluids given concomitant presenting hyponatremia, so as to prevent rapid correction of such.  Plan: Normal saline at 50 cc/h, with further evaluation for underlying etiology of presenting hyponatremia, as further detailed below.  As needed IV Ativan.  Repeat CMP and serum magnesium levels in the morning.  Monitor strict I's and O's and daily weights.  Work-up and management of presenting acute cystitis, as above.      #) Acute hypo-osmolar hypovolemic hyponatremia: Presenting serum sodium noted to be 126, with no prior data point available at this time in the EMR for point comparison to establish the chronicity versus acuity of  this finding.  Suspect a hypovolemic contribution from extra renal sodium losses in the setting of dehydration from recent nausea/vomiting, as further described above.  The setting of documented history of acquired hypothyroidism, will also check TSH.  We will pursue additional urine studies to assess for any contribution from SIADH, as further detailed below.  The patient is also on a few medications at home that can contribute to hyponatremia, including her antidepressants and opioid pain medications,   Raising the possibility of a degree of chronicity of this hyponatremic finding.  No evidence of associated acute focal neurologic deficits.  There appears to be at least a small contribution from hypovolemia given presenting nausea/vomiting, and we will therefore proceed with gentle IV fluids so as to prevent rapid correction of patient's serum sodium while additional evaluation of potential other contributing factors is pursued, as further detailed below.   Plan: monitor strict I's and O's and daily weights.  check UA, random urine sodium, urine osmolality.  Check serum osmolality to confirm suspected hypoosmolar etiology.  Repeat BMP in the morning.   Check TSH, as above.  Normal saline at 50 cc/h.  Monitor strict I's and O's and daily weights.      #) Generalized weakness: 2 to 3 days of generalized weakness in the absence of any acute focal weakness.  Suspect multifactorial contributions, including contributions from acute infection in the form of acute cystitis as well as dehydration and consequential element of hyponatremia, as further detailed above.  Plan: Work-up and management of acute cystitis, as above, as well as work-up and management of hyponatremia, as further detailed above.  Check TSH.  Physical therapy consult has been placed for the morning.      #) Essential hypertension: On the following antihypertensive medications at home lisinopril.  In the setting of recent nausea/vomiting, the patient reports that she has been unable to take this medication over the last several days.  In tandem with this inability to tolerate her home antihypertensive recently, there appears to be an additional hypertensive element from pain in the setting of inability to tolerate her home scheduled pain medications as well as an additional new discomfort in the form of suprapubic pain that appears to be consistent with underlying urinary tract infection.  Blood pressure improving with improving pain control as well as as needed IV doses of hydralazine, as further detailed above.  Plan:  Plan: As needed IV hydralazine.  Monitor on telemetry.  Close monitoring of ensuing blood pressure via routine vital signs.  Holding home lisinopril for now as it does not appear the patient would be able to tolerate any of her oral medications given ongoing nausea/vomiting.      #) Acquired hypothyroidism: On Synthroid as an outpatient.  Plan: In the setting of presenting hyponatremia, will check TSH.  Holding on Synthroid for now  Given patient's inability to tolerate p.o. at the present time.     #) GERD: On omeprazole as an outpatient.  Plan: We will  hold home omeprazole in setting of inability to tolerate p.o. at this time.  In the meantime, have ordered Protonix 40 mg IV daily.     DVT prophylaxis: scd's and Lovenox 40 mg subcu daily Code Status: Full code Family Communication: Case was discussed with the patient's husband, who was present at bedside Disposition Plan: Per Rounding Team Consults called: none  Admission status: Inpatient; pcu (due to initial hypertensive readings)     Of note, this patient was added by me to  the following Admit List/Treatment Team: [email or web address].      PLEASE NOTE THAT DRAGON DICTATION SOFTWARE WAS USED IN THE CONSTRUCTION OF THIS NOTE.   Angie Fava DO [facility] Pager [phone] From 7PM - 7AM   09/12/2020, 2:51 AM

## 2020-09-12 NOTE — Plan of Care (Signed)

## 2020-09-12 NOTE — Progress Notes (Signed)
PT Cancellation Note  Patient Details Name: Kathy Fischer MRN: [id number] DOB: February 09, 1941   Cancelled Treatment:    Reason Eval/Treat Not Completed: Patient not medically ready, just moved to 1421. Patient reports she is feeling poorly, legs are aching and nausea. Patient and son want to know what treatment she will be getting. Will check back  Tomorrow.   Rada Hay 09/12/2020, 10:17 AM Blanchard Kelch PT Acute Rehabilitation Services Pager [phone] Office [phone]

## 2020-09-12 NOTE — Progress Notes (Signed)
Patient was admitted this morning by my partner.  This is a 80 year old female with history of chronic pain syndrome followed by pain clinic she has spinal stenosis and chronic back pain hypertension hypothyroidism generalized anxiety disorder came into the ER with complaints of nausea vomiting suprapubic abdominal pain and discomfort and dysuria.  UA consistent with UTI and ketones in the urine.  She has allergies to penicillin she is being admitted for urinary tract infection on aztreonam and IV hydration.  She was also found to be hyponatremic which was thought to be due to hypovolemic hyponatremia and dehydration.  Sodium improving.  Follow-up labs in AM.  PT OT consult.

## 2020-09-12 NOTE — Progress Notes (Signed)
Pharmacy Antibiotic Note  Kathy Fischer is a 80 y.o. female admitted on 09/11/2020 with hx of crohn's disease presents with abd pain and weakness  Pharmacy has been consulted to dose aztreonam for UTI  Plan: Aztreonam 1gm IV q8h Follow renal function, cultures and clinical course  Height: 5' 3.5" (161.3 cm) Weight: 68 kg (150 lb) IBW/kg (Calculated) : 53.55  Temp (24hrs), Avg:97.7 F (36.5 C), Min:97.7 F (36.5 C), Max:97.7 F (36.5 C)  Recent Labs  Lab 09/11/20 2303  WBC 9.4  CREATININE 1.01*    Estimated Creatinine Clearance: 41.7 mL/min (A) (by C-G formula based on SCr of 1.01 mg/dL (H)).    Allergies  Allergen Reactions  . Penicillins   . Sulfa Antibiotics      Thank you for allowing pharmacy to be a part of this patient's care.  Arley Phenix RPh 09/12/2020, 2:13 AM

## 2020-09-12 NOTE — ED Notes (Signed)
[phone] Debroah Baller, son

## 2020-09-13 LAB — CBC
HCT: 37 % (ref 36.0–46.0)
Hemoglobin: 12.2 g/dL (ref 12.0–15.0)
MCH: 31.4 pg (ref 26.0–34.0)
MCHC: 33 g/dL (ref 30.0–36.0)
MCV: 95.4 fL (ref 80.0–100.0)
Platelets: 309 10*3/uL (ref 150–400)
RBC: 3.88 MIL/uL (ref 3.87–5.11)
RDW: 12.9 % (ref 11.5–15.5)
WBC: 10.2 10*3/uL (ref 4.0–10.5)
nRBC: 0 % (ref 0.0–0.2)

## 2020-09-13 LAB — COMPREHENSIVE METABOLIC PANEL
ALT: 21 U/L (ref 0–44)
AST: 53 U/L — ABNORMAL HIGH (ref 15–41)
Albumin: 3.8 g/dL (ref 3.5–5.0)
Alkaline Phosphatase: 74 U/L (ref 38–126)
Anion gap: 12 (ref 5–15)
BUN: 23 mg/dL (ref 8–23)
CO2: 17 mmol/L — ABNORMAL LOW (ref 22–32)
Calcium: 8.9 mg/dL (ref 8.9–10.3)
Chloride: 101 mmol/L (ref 98–111)
Creatinine, Ser: 0.81 mg/dL (ref 0.44–1.00)
GFR, Estimated: >60 mL/min (ref >60)
Glucose, Bld: 77 mg/dL (ref 70–99)
Potassium: 4.1 mmol/L (ref 3.5–5.1)
Sodium: 130 mmol/L — ABNORMAL LOW (ref 135–145)
Total Bilirubin: 0.1 mg/dL — ABNORMAL LOW (ref 0.3–1.2)
Total Protein: 7.4 g/dL (ref 6.5–8.1)

## 2020-09-13 MED ORDER — CEFDINIR 300 MG PO CAPS
300.0000 mg | ORAL_CAPSULE | Freq: Once | ORAL | Status: DC
  Filled 2020-09-13: qty 1

## 2020-09-13 MED ORDER — ACETAMINOPHEN 500 MG PO TABS
1000.0000 mg | ORAL_TABLET | Freq: Once | ORAL | Status: AC
  Administered 2020-09-13: 1000 mg via ORAL
  Filled 2020-09-13: qty 2

## 2020-09-13 MED ORDER — DICYCLOMINE HCL 20 MG PO TABS: 20.0000 mg | ORAL_TABLET | Freq: Four times a day (QID) | ORAL | 0 refills | Status: DC | PRN

## 2020-09-13 MED ORDER — CEFDINIR 300 MG PO CAPS: 300.0000 mg | ORAL_CAPSULE | Freq: Two times a day (BID) | ORAL | 0 refills | Status: DC

## 2020-09-13 MED ORDER — LISINOPRIL 20 MG PO TABS
20.0000 mg | ORAL_TABLET | Freq: Every day | ORAL | Status: DC
  Filled 2020-09-13: qty 1

## 2020-09-13 MED ORDER — CLONIDINE HCL 0.1 MG PO TABS
0.1000 mg | ORAL_TABLET | Freq: Every day | ORAL | Status: DC
  Administered 2020-09-13: 0.1 mg via ORAL
  Filled 2020-09-13: qty 1

## 2020-09-13 NOTE — Progress Notes (Signed)
Notified on call provider about patient requesting Ambien for sleep. Provider on call gave new orders.

## 2020-09-13 NOTE — Evaluation (Signed)
Physical Therapy Evaluation Patient Details Name: Kathy Fischer MRN: [id number] DOB: July 29, 1940 Today's Date: 09/13/2020   History of Present Illness  80 y.o. female who is admitted to [facility] on 09/11/2020 with acute cystitis after presenting from home to [facility] ED complaining of nausea/vomiting. Pt with medical history significant for spinal stenosis, chronic pain syndrome, hypertension, generalized anxiety disorder, acquired hypothyroidism, GERD, hyperlipidemia.  Clinical Impression  Pt ambulated 160' holding the IV pole and frequently holding the handrail in the hallway, no loss of balance, pt declined use of RW. She has a rollator at home which she uses when going out of the house. Encouraged pt to use the rollator inside for several days until her strength and balance return to baseline. Pt is modified independent with mobility, she is safe to DC home from PT standpoint.     Follow Up Recommendations No PT follow up    Equipment Recommendations  None recommended by PT    Recommendations for Other Services       Precautions / Restrictions Precautions Precautions: Fall Precaution Comments: pt denies h/o falls in past 6 months Restrictions Weight Bearing Restrictions: No      Mobility  Bed Mobility Overal bed mobility: Modified Independent             General bed mobility comments: used rail    Transfers Overall transfer level: Independent Equipment used: None                Ambulation/Gait Ambulation/Gait assistance: Modified independent (Device/Increase time) Gait Distance (Feet): 160 Feet Assistive device: IV Pole Gait Pattern/deviations: Step-through pattern;Decreased stride length Gait velocity: decr   General Gait Details: frequently reached for handrail in hall while also holding IV pole, but pt declined use of RW, recommended pt use her rollator at home for extra support until her strength/balance return to baseline, no loss of  balance  Stairs            Wheelchair Mobility    Modified Rankin (Stroke Patients Only)       Balance Overall balance assessment: Modified Independent                                           Pertinent Vitals/Pain Pain Assessment: No/denies pain    Home Living Family/patient expects to be discharged to:: Private residence Living Arrangements: Spouse/significant other Available Help at Discharge: Family;Available 24 hours/day Type of Home: House Home Access: Ramped entrance     Home Layout: One level Home Equipment: Walker - 4 wheels;Cane - single point      Prior Function Level of Independence: Independent with assistive device(s)         Comments: uses SPC or rollator when going out, Independent with bathing/dressing, does not drive but husband does     Hand Dominance        Extremity/Trunk Assessment   Upper Extremity Assessment Upper Extremity Assessment: Overall WFL for tasks assessed    Lower Extremity Assessment Lower Extremity Assessment: Overall WFL for tasks assessed    Cervical / Trunk Assessment Cervical / Trunk Assessment: Normal  Communication   Communication: No difficulties  Cognition Arousal/Alertness: Awake/alert Behavior During Therapy: WFL for tasks assessed/performed Overall Cognitive Status: Within Functional Limits for tasks assessed  General Comments      Exercises     Assessment/Plan    PT Assessment Patent does not need any further PT services  PT Problem List         PT Treatment Interventions      PT Goals (Current goals can be found in the Care Plan section)  Acute Rehab PT Goals Patient Stated Goal: go home to her cats PT Goal Formulation: All assessment and education complete, DC therapy    Frequency     Barriers to discharge        Co-evaluation               AM-PAC PT "6 Clicks" Mobility  Outcome Measure Help  needed turning from your back to your side while in a flat bed without using bedrails?: None Help needed moving from lying on your back to sitting on the side of a flat bed without using bedrails?: None Help needed moving to and from a bed to a chair (including a wheelchair)?: None Help needed standing up from a chair using your arms (e.g., wheelchair or bedside chair)?: None Help needed to walk in hospital room?: None Help needed climbing 3-5 steps with a railing? : None 6 Click Score: 24    End of Session   Activity Tolerance: Patient tolerated treatment well Patient left: in chair;with call bell/phone within reach Nurse Communication: Mobility status      Time: 0092-3300 PT Time Calculation (min) (ACUTE ONLY): 13 min   Charges:   PT Evaluation $PT Eval Low Complexity: 1 Low         Tamala Ser PT 09/13/2020  Acute Rehabilitation Services Pager [phone] Office [phone]

## 2020-09-13 NOTE — Discharge Summary (Signed)
Physician Discharge Summary  Kathy Fischer JME:[id number] DOB: 09/22/1940 DOA: 09/11/2020  PCP: Clare Charon., MD  Admit date: 09/11/2020 Discharge date: 09/13/2020  Admitted From: Home  disposition: Home  Recommendations for Outpatient Follow-up:  1. Follow up with PCP in 1-2 weeks 2. Please obtain BMP/CBC in one week 3. Please follow urine culture that was done in the hospital.  This is pending at the time of discharge.  Home Health none Equipment/Devices: None  Discharge Condition stable  CODE STATUS: Full code  diet recommendation: Cardiac Brief/Interim Summary:80 yo female admitted with nausea vomiting and abdominal pain found to have urinary tract infection which was treated with Rocephin her symptoms improved.  Discharge Diagnoses:  Principal Problem:   Acute cystitis Active Problems:   Nausea & vomiting   Generalized weakness   Abdominal pain   Hyponatremia   Hypertension   Acquired hypothyroidism  #1 urinary tract infection patient admitted with dysuria suprapubic discomfort nausea vomiting and generalized weakness.  UA consistent with significant pyuria and UTI.  She received Rocephin during the hospital stay she will be discharged on cefdinir.  Urine culture pending at the time of discharge.  #2 essential hypertension continue all home medications.  #3 hypovolemic hyponatremia secondary to decreased p.o. intake nausea vomiting.    #4 chronic pain syndrome continue home meds follow-up with pain management.  #5 hypothyroidism continue Synthroid.   Estimated body mass index is 27.87 kg/m as calculated from the following:   Height as of this encounter: 5' 3.5" (1.613 m).   Weight as of this encounter: 72.5 kg.  Discharge Instructions  Discharge Instructions    Diet - low sodium heart healthy   Complete by: As directed    Increase activity slowly   Complete by: As directed      Allergies as of 09/13/2020      Reactions   Dexamethasone Hypertension    Penicillins    Sulfa Antibiotics       Medication List    TAKE these medications   buprenorphine 5 MCG/HR Ptwk Commonly known as: [place] 1 patch onto the skin every 7 (seven) days. Start taking on: Oct 02, 2020   buPROPion 150 MG 24 hr tablet Commonly known as: WELLBUTRIN XL Take 1 tablet by mouth every morning.   carisoprodol 350 MG tablet Commonly known as: SOMA Take 350 mg by mouth 3 (three) times daily.   cefdinir 300 MG capsule Commonly known as: OMNICEF Take 1 capsule (300 mg total) by mouth 2 (two) times daily.   Cholecalciferol 1.25 MG (50000 UT) capsule Take 1 capsule by mouth 2 (two) times a week.   dicyclomine 20 MG tablet Commonly known as: BENTYL Take 1 tablet by mouth every 6 (six) hours as needed for cramping.   DULoxetine 60 MG capsule Commonly known as: CYMBALTA Take 1 capsule by mouth daily.   gabapentin 300 MG capsule Commonly known as: NEURONTIN Take 1 capsule by mouth 4 (four) times daily.   HYDROmorphone 2 MG tablet Commonly known as: DILAUDID Take 2 mg by mouth 2 (two) times daily.   hydrOXYzine 25 MG tablet Commonly known as: ATARAX/VISTARIL Take 1 tablet by mouth daily as needed. May take up to 3 to 4 times daily   levothyroxine 75 MCG tablet Commonly known as: SYNTHROID Take 75 mcg by mouth daily.   lisinopril 20 MG tablet Commonly known as: ZESTRIL Take 20 mg by mouth daily.   meclizine 25 MG tablet Commonly known as: ANTIVERT Take 25 mg  by mouth 3 (three) times daily as needed.   naloxone 4 MG/0.1ML Liqd nasal spray kit Commonly known as: NARCAN Place 1 spray into the nose once as needed. one spray by Nasal route once as needed for up to 1 dose. Use per instruction in event of accidential over dose and call 911.   omeprazole 40 MG capsule Commonly known as: PRILOSEC Take 1 capsule by mouth daily.   prochlorperazine 5 MG tablet Commonly known as: COMPAZINE Take 1 tablet by mouth every 6 (six) hours as needed.    rosuvastatin 20 MG tablet Commonly known as: CRESTOR Take 20 mg by mouth daily.   zolpidem 10 MG tablet Commonly known as: AMBIEN Take 10 mg by mouth at bedtime as needed.       Follow-up Information    Clare Charon., MD Follow up.   Specialty: Family Medicine Contact information: [address] [place] [address] [phone]              Allergies  Allergen Reactions  . Dexamethasone Hypertension  . Penicillins   . Sulfa Antibiotics     Consultations: None   Procedures/Studies: DG Chest Portable 1 View  Result Date: 09/12/2020 CLINICAL DATA:  Nausea and vomiting and weakness EXAM: PORTABLE CHEST 1 VIEW COMPARISON:  None. FINDINGS: The heart size and mediastinal contours are within normal limits. Both lungs are clear. The visualized skeletal structures are unremarkable. IMPRESSION: No active disease. Electronically Signed   By: Inez Catalina M.D.   On: 09/12/2020 01:43   CT Renal Stone Study  Result Date: 09/12/2020 CLINICAL DATA:  Flank pain EXAM: CT ABDOMEN AND PELVIS WITHOUT CONTRAST TECHNIQUE: Multidetector CT imaging of the abdomen and pelvis was performed following the standard protocol without IV contrast. COMPARISON:  None. FINDINGS: Lower chest: No acute abnormality. Hepatobiliary: Liver demonstrates multiple cysts. The largest of these is noted centrally measuring 5.3 cm. Gallbladder is not well visualized. Question prior cholecystectomy Pancreas: Unremarkable. No pancreatic ductal dilatation or surrounding inflammatory changes. Spleen: Normal in size without focal abnormality. Adrenals/Urinary Tract: Adrenal glands are within normal limits. Kidneys are well visualized bilaterally. Extrarenal pelves are seen bilaterally without obstructive change. The ureters are within normal limits. Bladder is partially distended. Stomach/Bowel: Changes of mild colonic constipation are noted. No obstructive changes are seen. The appendix is  not well visualized although no inflammatory changes are noted. Small bowel is unremarkable. Stomach demonstrates a moderate-sized sliding-type hiatal hernia. Vascular/Lymphatic: Aortic atherosclerosis. No enlarged abdominal or pelvic lymph nodes. Reproductive: Status post hysterectomy. No adnexal masses. Other: No abdominal wall hernia or abnormality. No abdominopelvic ascites. Musculoskeletal: No acute or significant osseous findings. IMPRESSION: Hepatic cysts. Mild colonic constipation. Moderate-sized sliding-type hiatal hernia. Electronically Signed   By: Inez Catalina M.D.   On: 09/12/2020 00:32    (Echo, Carotid, EGD, Colonoscopy, ERCP)    Subjective: Patient resting in bed anxious to go home no nausea vomiting abdominal pain better  Discharge Exam: Vitals:   09/12/20 2019 09/13/20 0456  BP: 130/69 (!) 155/76  Pulse: 75 72  Resp: 20 20  Temp: 98.1 F (36.7 C) 98.4 F (36.9 C)  SpO2: 93% 99%   Vitals:   09/12/20 1706 09/12/20 2019 09/13/20 0456 09/13/20 0500  BP: 122/74 130/69 (!) 155/76   Pulse: 67 75 72   Resp: _0 Temp: 98.3 F (36.8 C) 98.1 F (36.7 C) 98.4 F (36.9 C)   TempSrc: Oral Oral Oral   SpO2:  93% 99%   Weight:    72.5 kg  Height:        General: Pt is alert, awake, not in acute distress Cardiovascular: RRR, S1/S2 +, no rubs, no gallops Respiratory: CTA bilaterally, no wheezing, no rhonchi Abdominal: Soft, NT, ND, bowel sounds + Extremities: no edema, no cyanosis    The results of significant diagnostics from this hospitalization (including imaging, microbiology, ancillary and laboratory) are listed below for reference.     Microbiology: Recent Results (from the past 240 hour(s))  Culture, Urine     Status: None (Preliminary result)   Collection Time: 09/11/20 10:03 PM   Specimen: Urine, Clean Catch  Result Value [place] Status   Specimen Description   Final    URINE, CLEAN CATCH Performed at [facility], [place]  [address] [place], [place] [address]    Special Requests   Final    NONE Performed at [facility], [place] [address] [place], [place] [address]    Culture   Final    CULTURE REINCUBATED FOR BETTER GROWTH Performed at [facility], [place] [address], [place], [place] [address]    Report Status PENDING  Incomplete  Resp Panel by RT-PCR (Flu A&B, Covid) Nasopharyngeal Swab     Status: None   Collection Time: 09/12/20  2:09 AM   Specimen: Nasopharyngeal Swab; Nasopharyngeal(NP) swabs in vial transport medium  Result Value Ref [place] Status   SARS Coronavirus 2 by RT PCR NEGATIVE NEGATIVE Final    Comment: (NOTE) SARS-CoV-2 target nucleic acids are NOT DETECTED.  The SARS-CoV-2 RNA is generally detectable in upper respiratory specimens during the acute phase of infection. The lowest concentration of SARS-CoV-2 viral copies this assay can detect is 138 copies/mL. A negative result does not preclude SARS-Cov-2 infection and should not be used as the sole basis for treatment or other patient management decisions. A negative result may occur with  improper specimen collection/handling, submission of specimen other than nasopharyngeal swab, presence of viral mutation(s) within the areas targeted by this assay, and inadequate number of viral copies(<138 copies/mL). A negative result must be combined with clinical observations, patient history, and epidemiological information. The expected result is Negative.  Fact Sheet for Patients:  EntrepreneurPulse.com.au  Fact Sheet for Healthcare Providers:  IncredibleEmployment.be  This test is no t yet approved or cleared by the [place] FDA and  has been authorized for detection and/or diagnosis of SARS-CoV-2 by FDA under an Emergency Use Authorization (EUA). This EUA will remain  in effect (meaning this test can be used) for the duration of the COVID-19 declaration under Section  564(b)(1) of the Act, 21 U.S.C.section 360bbb-3(b)(1), unless the authorization is terminated  or revoked sooner.       Influenza A by PCR NEGATIVE NEGATIVE Final   Influenza B by PCR NEGATIVE NEGATIVE Final    Comment: (NOTE) The Xpert Xpress SARS-CoV-2/FLU/RSV plus assay is intended as an aid in the diagnosis of influenza from Nasopharyngeal swab specimens and should not be used as a sole basis for treatment. Nasal washings and aspirates are unacceptable for Xpert Xpress SARS-CoV-2/FLU/RSV testing.  Fact Sheet for Patients: EntrepreneurPulse.com.au  Fact Sheet for Healthcare Providers: IncredibleEmployment.be  This test is not yet approved or cleared by the [place] FDA and has been authorized for detection and/or diagnosis of SARS-CoV-2 by FDA under an Emergency Use Authorization (EUA). This EUA will remain in effect (meaning this test can be used) for the duration of the COVID-19  declaration under Section 564(b)(1) of the Act, 21 U.S.C. section 360bbb-3(b)(1), unless the authorization is terminated or revoked.  Performed at [facility], [place] [address] [place], [place] [address]   Culture, blood (Routine X 2) w Reflex to ID Panel     Status: None (Preliminary result)   Collection Time: 09/12/20  3:30 AM   Specimen: BLOOD  Result Value [place] Status   Specimen Description   Final    BLOOD RIGHT ANTECUBITAL Performed at [place] [address] [place], [place] [address]    Special Requests   Final    BOTTLES DRAWN AEROBIC AND ANAEROBIC Blood Culture results may not be optimal due to an inadequate volume of blood received in culture bottles Performed at [place] [address], [place], [place] [address]    Culture   Final    NO GROWTH 1 DAY Performed at [facility], [place] [address] [place], [place] [address]    Report Status PENDING  Incomplete   Culture, blood (Routine X 2) w Reflex to ID Panel     Status: None (Preliminary result)   Collection Time: 09/12/20  3:35 AM   Specimen: BLOOD  Result Value Ref [place] Status   Specimen Description   Final    BLOOD LEFT ANTECUBITAL Performed at [place] [address] [place], [place] [address]    Special Requests   Final    BOTTLES DRAWN AEROBIC AND ANAEROBIC Blood Culture results may not be optimal due to an inadequate volume of blood received in culture bottles Performed at [place] [address] [place], [place] [address]    Culture   Final    NO GROWTH 1 DAY Performed at [facility], [place] [address], [place], [place] [address]    Report Status PENDING  Incomplete     Labs: BNP (last 3 results) No results for input(s): BNP in the last 8760 hours. Basic Metabolic Panel: Recent Labs  Lab 09/11/20 2303 09/12/20 0444 09/13/20 0434  NA 126* 134* 130*  K 4.7 4.0 4.1  CL 97* 105 101  CO2 17* 15* 17*  GLUCOSE 122* 107* 77  BUN _0 CREATININE 1.01* 0.90 0.81  CALCIUM 9.7 9.4 8.9  MG  --  1.9  --    Liver Function Tests: Recent Labs  Lab 09/11/20 2303 09/12/20 0444 09/13/20 0434  AST 24 27 53*  ALT _1 ALKPHOS 89 77 74  BILITOT 0.5 0.8 0.1*  PROT 8.5* 7.4 7.4  ALBUMIN 4.5 4.0 3.8   No results for input(s): LIPASE, AMYLASE in the last 168 hours. No results for input(s): AMMONIA in the last 168 hours. CBC: Recent Labs  Lab 09/11/20 2303 09/12/20 0444 09/13/20 0434  WBC 9.4 10.4 10.2  NEUTROABS 7.9* 7.6  --   HGB 12.9 12.8 12.2  HCT 37.6 37.9 37.0  MCV 90.8 92.7 95.4  PLT 333 314 309   Cardiac Enzymes: No results for input(s): CKTOTAL, CKMB, CKMBINDEX, TROPONINI in the last 168 hours. BNP: Invalid input(s): POCBNP CBG: No results for input(s): GLUCAP in the last 168 hours. D-Dimer No results for input(s): DDIMER in the last 72 hours. Hgb A1c No results for input(s): HGBA1C in the  last 72 hours. Lipid Profile No results for input(s): CHOL, HDL, LDLCALC, TRIG, CHOLHDL, LDLDIRECT in the last 72 hours. Thyroid function studies Recent Labs    09/12/20 0444  TSH 3.575   Anemia work up No results for input(s): VITAMINB12, FOLATE, FERRITIN, TIBC, IRON, RETICCTPCT in the last 72 hours. Urinalysis    Component Value Date/Time   COLORURINE YELLOW 09/11/2020 2303   APPEARANCEUR CLEAR 09/11/2020 2303   LABSPEC 1.011 09/11/2020 2303   PHURINE 6.0 09/11/2020 2303   GLUCOSEU NEGATIVE 09/11/2020 2303   HGBUR SMALL (A) 09/11/2020 2303   BILIRUBINUR NEGATIVE 09/11/2020 2303   KETONESUR 5 (A) 09/11/2020 2303   PROTEINUR 100 (A) 09/11/2020 2303   NITRITE NEGATIVE 09/11/2020 2303   LEUKOCYTESUR MODERATE (A) 09/11/2020 2303   Sepsis Labs Invalid input(s): PROCALCITONIN,  WBC,  LACTICIDVEN Microbiology Recent Results (from the past 240 hour(s))  Culture, Urine     Status: None (Preliminary result)   Collection Time: 09/11/20 10:03 PM   Specimen: Urine, Clean Catch  Result Value Ref [place] Status   Specimen Description   Final    URINE, CLEAN CATCH Performed at [facility], [place] [address] [place], [place] [address]    Special Requests   Final    NONE Performed at [facility], [place] [address] [place], [place] [address]    Culture   Final    CULTURE REINCUBATED FOR BETTER GROWTH Performed at [facility], [place] [address], [place], [place] [address]    Report Status PENDING  Incomplete  Resp Panel by RT-PCR (Flu A&B, Covid) Nasopharyngeal Swab     Status: None   Collection Time: 09/12/20  2:09 AM   Specimen: Nasopharyngeal Swab; Nasopharyngeal(NP) swabs in vial transport medium  Result Value Ref [place] Status   SARS Coronavirus 2 by RT PCR NEGATIVE NEGATIVE Final    Comment: (NOTE) SARS-CoV-2 target nucleic acids are NOT DETECTED.  The SARS-CoV-2 RNA is generally detectable in upper respiratory specimens during the acute  phase of infection. The lowest concentration of SARS-CoV-2 viral copies this assay can detect is 138 copies/mL. A negative result does not preclude SARS-Cov-2 infection and should not be used as the sole basis for treatment or other patient management decisions. A negative result may occur with  improper specimen collection/handling, submission of specimen other than nasopharyngeal swab, presence of viral mutation(s) within the areas targeted by this assay, and inadequate number of viral copies(<138 copies/mL). A negative result must be combined with clinical observations, patient history, and epidemiological information. The expected result is Negative.  Fact Sheet for Patients:  EntrepreneurPulse.com.au  Fact Sheet for Healthcare Providers:  IncredibleEmployment.be  This test is no t yet approved or cleared by the [place] FDA and  has been authorized for detection and/or diagnosis of SARS-CoV-2 by FDA under an Emergency Use Authorization (EUA). This EUA will remain  in effect (meaning this test can be used) for the duration of the COVID-19 declaration under Section 564(b)(1) of the Act, 21 U.S.C.section 360bbb-3(b)(1), unless the authorization is terminated  or revoked sooner.       Influenza A by PCR NEGATIVE NEGATIVE Final   Influenza B by PCR NEGATIVE NEGATIVE Final    Comment: (NOTE) The Xpert Xpress SARS-CoV-2/FLU/RSV plus assay is intended as an aid in the diagnosis of influenza from Nasopharyngeal swab specimens and should not be used as a sole basis for treatment. Nasal washings and aspirates are unacceptable for Xpert Xpress SARS-CoV-2/FLU/RSV testing.  Fact Sheet for Patients: EntrepreneurPulse.com.au  Fact Sheet for Healthcare Providers: IncredibleEmployment.be  This test is not yet approved or cleared by the [place] FDA and has been authorized for detection and/or diagnosis  of  SARS-CoV-2 by FDA under an Emergency Use Authorization (EUA). This EUA will remain in effect (meaning this test can be used) for the duration of the COVID-19 declaration under Section 564(b)(1) of the Act, 21 U.S.C. section 360bbb-3(b)(1), unless the authorization is terminated or revoked.  Performed at [facility], [place] [address] [place], [place] [address]   Culture, blood (Routine X 2) w Reflex to ID Panel     Status: None (Preliminary result)   Collection Time: 09/12/20  3:30 AM   Specimen: BLOOD  Result Value Ref [place] Status   Specimen Description   Final    BLOOD RIGHT ANTECUBITAL Performed at [place] [address] [place], [place] [address]    Special Requests   Final    BOTTLES DRAWN AEROBIC AND ANAEROBIC Blood Culture results may not be optimal due to an inadequate volume of blood received in culture bottles Performed at [place] [address], [place], [place] [address]    Culture   Final    NO GROWTH 1 DAY Performed at [facility], [place] [address], [place], [place] [address]    Report Status PENDING  Incomplete  Culture, blood (Routine X 2) w Reflex to ID Panel     Status: None (Preliminary result)   Collection Time: 09/12/20  3:35 AM   Specimen: BLOOD  Result Value Ref [place] Status   Specimen Description   Final    BLOOD LEFT ANTECUBITAL Performed at [place] [address], [place], [place] [address]    Special Requests   Final    BOTTLES DRAWN AEROBIC AND ANAEROBIC Blood Culture results may not be optimal due to an inadequate volume of blood received in culture bottles Performed at [place] [address], [place], [place] [address]    Culture   Final    NO GROWTH 1 DAY Performed at [facility], [place] [address], [place],  [address]    Report Status PENDING  Incomplete     Time coordinating discharge: 39  minutes  SIGNED:   Georgette Shell, MD  [facility] 09/13/2020, 11:01 AM

## 2020-09-13 NOTE — Progress Notes (Signed)
Pt to be discharged to home this afternoon. Discharge instructions including Discharge Medications  And schedules for these Medications. Pt verbalized understanding of all discharge instructions. Discharge AVS with the Pt at time of discharge

## 2020-09-14 LAB — URINE CULTURE: Culture: 60000 — AB

## 2020-09-17 LAB — CULTURE, BLOOD (ROUTINE X 2)
Culture: NO GROWTH
Culture: NO GROWTH

## 2020-12-29 ENCOUNTER — Inpatient Hospital Stay (HOSPITAL_COMMUNITY)
Admission: EM | Admit: 2020-12-29 | Discharge: 2021-01-02 | DRG: 305 | Disposition: A | Discharge status: Home / Self Care | Payer: Medicare Other | Attending: Internal Medicine | Admitting: Internal Medicine

## 2020-12-29 DIAGNOSIS — K449 Diaphragmatic hernia without obstruction or gangrene: Secondary | ICD-10-CM | POA: Diagnosis present

## 2020-12-29 DIAGNOSIS — Z79890 Hormone replacement therapy (postmenopausal): Secondary | ICD-10-CM

## 2020-12-29 DIAGNOSIS — I160 Hypertensive urgency: Principal | ICD-10-CM | POA: Diagnosis present

## 2020-12-29 DIAGNOSIS — E039 Hypothyroidism, unspecified: Secondary | ICD-10-CM | POA: Diagnosis present

## 2020-12-29 DIAGNOSIS — R350 Frequency of micturition: Secondary | ICD-10-CM | POA: Diagnosis present

## 2020-12-29 DIAGNOSIS — Z882 Allergy status to sulfonamides status: Secondary | ICD-10-CM

## 2020-12-29 DIAGNOSIS — E871 Hypo-osmolality and hyponatremia: Secondary | ICD-10-CM | POA: Diagnosis not present

## 2020-12-29 DIAGNOSIS — K5090 Crohn's disease, unspecified, without complications: Secondary | ICD-10-CM | POA: Diagnosis present

## 2020-12-29 DIAGNOSIS — I10 Essential (primary) hypertension: Secondary | ICD-10-CM | POA: Diagnosis present

## 2020-12-29 DIAGNOSIS — Z888 Allergy status to other drugs, medicaments and biological substances status: Secondary | ICD-10-CM

## 2020-12-29 DIAGNOSIS — E872 Acidosis: Secondary | ICD-10-CM | POA: Diagnosis present

## 2020-12-29 DIAGNOSIS — Z880 Allergy status to penicillin: Secondary | ICD-10-CM

## 2020-12-29 DIAGNOSIS — Z20822 Contact with and (suspected) exposure to covid-19: Secondary | ICD-10-CM | POA: Diagnosis present

## 2020-12-29 DIAGNOSIS — Z79899 Other long term (current) drug therapy: Secondary | ICD-10-CM

## 2020-12-29 DIAGNOSIS — F419 Anxiety disorder, unspecified: Secondary | ICD-10-CM | POA: Diagnosis present

## 2020-12-29 LAB — URINALYSIS, ROUTINE W REFLEX MICROSCOPIC
Bacteria, UA: NONE SEEN
Bilirubin Urine: NEGATIVE
Glucose, UA: NEGATIVE mg/dL
Hgb urine dipstick: NEGATIVE
Ketones, ur: NEGATIVE mg/dL
Leukocytes,Ua: NEGATIVE
Nitrite: NEGATIVE
Protein, ur: 100 mg/dL — AB
Specific Gravity, Urine: 1.012 (ref 1.005–1.030)
pH: 6 (ref 5.0–8.0)

## 2020-12-29 LAB — CBC WITH DIFFERENTIAL/PLATELET
Abs Immature Granulocytes: 0.05 10*3/uL (ref 0.00–0.07)
Basophils Absolute: 0.1 10*3/uL (ref 0.0–0.1)
Basophils Relative: 1 %
Eosinophils Absolute: 0.1 10*3/uL (ref 0.0–0.5)
Eosinophils Relative: 1 %
HCT: 37.8 % (ref 36.0–46.0)
Hemoglobin: 12.6 g/dL (ref 12.0–15.0)
Immature Granulocytes: 1 %
Lymphocytes Relative: 12 %
Lymphs Abs: 1.3 10*3/uL (ref 0.7–4.0)
MCH: 30.9 pg (ref 26.0–34.0)
MCHC: 33.3 g/dL (ref 30.0–36.0)
MCV: 92.6 fL (ref 80.0–100.0)
Monocytes Absolute: 0.7 10*3/uL (ref 0.1–1.0)
Monocytes Relative: 7 %
Neutro Abs: 8.2 10*3/uL — ABNORMAL HIGH (ref 1.7–7.7)
Neutrophils Relative %: 78 %
Platelets: 312 10*3/uL (ref 150–400)
RBC: 4.08 MIL/uL (ref 3.87–5.11)
RDW: 12.1 % (ref 11.5–15.5)
WBC: 10.3 10*3/uL (ref 4.0–10.5)
nRBC: 0 % (ref 0.0–0.2)

## 2020-12-29 NOTE — ED Provider Notes (Signed)
Emergency Medicine Provider Triage Evaluation Note  Kathy Fischer , a 80 y.o. female  was evaluated in triage.  Pt complains of possible UTI.  She is having lower abdominal pain, nausea, and feeling weak.  She was dizzy earlier today and took a meclizine which resolved it.  History of frequent UTIs and states it feels like that.  Her blood pressure is apparently always high, she is on clonidine.  She took it this morning but has not taken it tonight.  She is not having any chest pain or shortness of breath.  Review of Systems  Positive: Nausea, AP, weakness, dizziness Negative: Dysuria, hematuria, chest pain, shortness of breath  Physical Exam  BP (!) 207/112 (BP Location: Left Arm) Comment: RN Notified  Pulse 89   Temp 97.9 F (36.6 C) (Oral)   Resp 18   SpO2 99%  Gen:   Awake, no distress   Resp:  Normal effort  MSK:   Moves extremities without difficulty  Other:  Lower abdominal tenderess without any guarding.  Medical Decision Making  Medically screening exam initiated at 11:11 PM.  Appropriate orders placed.  Kathy Fischer was informed that the remainder of the evaluation will be completed by another provider, this initial triage assessment does not replace that evaluation, and the importance of remaining in the ED until their evaluation is complete.     Theron Arista, PA-C 12/29/20 2314    Margarita Grizzle, MD 12/29/20 [phone]

## 2020-12-29 NOTE — ED Notes (Signed)
Urine specimen and culture collected in triage. [employer]

## 2020-12-30 ENCOUNTER — Encounter (HOSPITAL_COMMUNITY): Payer: Self-pay

## 2020-12-30 ENCOUNTER — Emergency Department (HOSPITAL_COMMUNITY): Payer: Medicare Other

## 2020-12-30 ENCOUNTER — Other Ambulatory Visit: Payer: Self-pay

## 2020-12-30 DIAGNOSIS — I160 Hypertensive urgency: Secondary | ICD-10-CM | POA: Diagnosis present

## 2020-12-30 DIAGNOSIS — I10 Essential (primary) hypertension: Secondary | ICD-10-CM | POA: Diagnosis present

## 2020-12-30 DIAGNOSIS — Z882 Allergy status to sulfonamides status: Secondary | ICD-10-CM | POA: Diagnosis not present

## 2020-12-30 DIAGNOSIS — E871 Hypo-osmolality and hyponatremia: Secondary | ICD-10-CM | POA: Diagnosis present

## 2020-12-30 DIAGNOSIS — Z888 Allergy status to other drugs, medicaments and biological substances status: Secondary | ICD-10-CM | POA: Diagnosis not present

## 2020-12-30 DIAGNOSIS — F419 Anxiety disorder, unspecified: Secondary | ICD-10-CM | POA: Diagnosis present

## 2020-12-30 DIAGNOSIS — E872 Acidosis: Secondary | ICD-10-CM | POA: Diagnosis present

## 2020-12-30 DIAGNOSIS — Z79899 Other long term (current) drug therapy: Secondary | ICD-10-CM | POA: Diagnosis not present

## 2020-12-30 DIAGNOSIS — K5090 Crohn's disease, unspecified, without complications: Secondary | ICD-10-CM | POA: Diagnosis present

## 2020-12-30 DIAGNOSIS — Z20822 Contact with and (suspected) exposure to covid-19: Secondary | ICD-10-CM | POA: Diagnosis present

## 2020-12-30 DIAGNOSIS — E039 Hypothyroidism, unspecified: Secondary | ICD-10-CM | POA: Diagnosis present

## 2020-12-30 DIAGNOSIS — Z880 Allergy status to penicillin: Secondary | ICD-10-CM | POA: Diagnosis not present

## 2020-12-30 DIAGNOSIS — Z79890 Hormone replacement therapy (postmenopausal): Secondary | ICD-10-CM | POA: Diagnosis not present

## 2020-12-30 DIAGNOSIS — R350 Frequency of micturition: Secondary | ICD-10-CM | POA: Diagnosis present

## 2020-12-30 DIAGNOSIS — K449 Diaphragmatic hernia without obstruction or gangrene: Secondary | ICD-10-CM | POA: Diagnosis present

## 2020-12-30 LAB — RENAL FUNCTION PANEL
Albumin: 4.2 g/dL (ref 3.5–5.0)
Albumin: 4 g/dL (ref 3.5–5.0)
Albumin: 4.3 g/dL (ref 3.5–5.0)
Anion gap: 15 (ref 5–15)
Anion gap: 11 (ref 5–15)
Anion gap: 13 (ref 5–15)
BUN: 18 mg/dL (ref 8–23)
BUN: 22 mg/dL (ref 8–23)
BUN: 23 mg/dL (ref 8–23)
CO2: 17 mmol/L — ABNORMAL LOW (ref 22–32)
CO2: 20 mmol/L — ABNORMAL LOW (ref 22–32)
CO2: 20 mmol/L — ABNORMAL LOW (ref 22–32)
Calcium: 9.6 mg/dL (ref 8.9–10.3)
Calcium: 8.9 mg/dL (ref 8.9–10.3)
Calcium: 9.1 mg/dL (ref 8.9–10.3)
Chloride: 100 mmol/L (ref 98–111)
Chloride: 100 mmol/L (ref 98–111)
Chloride: 99 mmol/L (ref 98–111)
Creatinine, Ser: 0.82 mg/dL (ref 0.44–1.00)
Creatinine, Ser: 1.09 mg/dL — ABNORMAL HIGH (ref 0.44–1.00)
Creatinine, Ser: 1.04 mg/dL — ABNORMAL HIGH (ref 0.44–1.00)
GFR, Estimated: >60 mL/min (ref >60)
GFR, Estimated: 51 mL/min — ABNORMAL LOW (ref >60)
GFR, Estimated: 54 mL/min — ABNORMAL LOW (ref >60)
Glucose, Bld: 130 mg/dL — ABNORMAL HIGH (ref 70–99)
Glucose, Bld: 130 mg/dL — ABNORMAL HIGH (ref 70–99)
Glucose, Bld: 107 mg/dL — ABNORMAL HIGH (ref 70–99)
Phosphorus: 2.7 mg/dL (ref 2.5–4.6)
Phosphorus: 4.2 mg/dL (ref 2.5–4.6)
Phosphorus: 4.6 mg/dL (ref 2.5–4.6)
Potassium: 3.6 mmol/L (ref 3.5–5.1)
Potassium: 4.2 mmol/L (ref 3.5–5.1)
Potassium: 4.3 mmol/L (ref 3.5–5.1)
Sodium: 132 mmol/L — ABNORMAL LOW (ref 135–145)
Sodium: 131 mmol/L — ABNORMAL LOW (ref 135–145)
Sodium: 132 mmol/L — ABNORMAL LOW (ref 135–145)

## 2020-12-30 LAB — CREATININE, SERUM
Creatinine, Ser: 0.87 mg/dL (ref 0.44–1.00)
GFR, Estimated: >60 mL/min (ref >60)

## 2020-12-30 LAB — CBC
HCT: 38.7 % (ref 36.0–46.0)
Hemoglobin: 13.3 g/dL (ref 12.0–15.0)
MCH: 31.1 pg (ref 26.0–34.0)
MCHC: 34.4 g/dL (ref 30.0–36.0)
MCV: 90.6 fL (ref 80.0–100.0)
Platelets: 345 10*3/uL (ref 150–400)
RBC: 4.27 MIL/uL (ref 3.87–5.11)
RDW: 12.1 % (ref 11.5–15.5)
WBC: 14.4 10*3/uL — ABNORMAL HIGH (ref 4.0–10.5)
nRBC: 0 % (ref 0.0–0.2)

## 2020-12-30 LAB — BASIC METABOLIC PANEL
Anion gap: 11 (ref 5–15)
BUN: 20 mg/dL (ref 8–23)
CO2: 19 mmol/L — ABNORMAL LOW (ref 22–32)
Calcium: 9.3 mg/dL (ref 8.9–10.3)
Chloride: 97 mmol/L — ABNORMAL LOW (ref 98–111)
Creatinine, Ser: 0.89 mg/dL (ref 0.44–1.00)
GFR, Estimated: >60 mL/min (ref >60)
Glucose, Bld: 107 mg/dL — ABNORMAL HIGH (ref 70–99)
Potassium: 4.3 mmol/L (ref 3.5–5.1)
Sodium: 127 mmol/L — ABNORMAL LOW (ref 135–145)

## 2020-12-30 LAB — RESP PANEL BY RT-PCR (FLU A&B, COVID) ARPGX2
Influenza A by PCR: NEGATIVE
Influenza B by PCR: NEGATIVE
SARS Coronavirus 2 by RT PCR: NEGATIVE

## 2020-12-30 LAB — LACTIC ACID, PLASMA
Lactic Acid, Venous: 1.2 mmol/L (ref 0.5–1.9)
Lactic Acid, Venous: 1.4 mmol/L (ref 0.5–1.9)

## 2020-12-30 LAB — TROPONIN I (HIGH SENSITIVITY)
Troponin I (High Sensitivity): 6 ng/L (ref <18)
Troponin I (High Sensitivity): 8 ng/L (ref <18)

## 2020-12-30 MED ORDER — IOHEXOL 350 MG/ML SOLN
100.0000 mL | Freq: Once | INTRAVENOUS | Status: AC | PRN
  Administered 2020-12-30: 80 mL via INTRAVENOUS

## 2020-12-30 MED ORDER — LISINOPRIL 20 MG PO TABS
20.0000 mg | ORAL_TABLET | Freq: Every day | ORAL | Status: DC
  Administered 2020-12-30 – 2021-01-02 (×4): 20 mg via ORAL
  Filled 2020-12-30 (×4): qty 1

## 2020-12-30 MED ORDER — ROSUVASTATIN CALCIUM 20 MG PO TABS
20.0000 mg | ORAL_TABLET | Freq: Every day | ORAL | Status: DC
  Administered 2020-12-30 – 2021-01-02 (×4): 20 mg via ORAL
  Filled 2020-12-30 (×4): qty 1

## 2020-12-30 MED ORDER — GABAPENTIN 300 MG PO CAPS
300.0000 mg | ORAL_CAPSULE | Freq: Four times a day (QID) | ORAL | Status: DC
  Administered 2020-12-30 – 2021-01-02 (×12): 300 mg via ORAL
  Filled 2020-12-30 (×12): qty 1

## 2020-12-30 MED ORDER — SODIUM CHLORIDE 0.9 % IV SOLN: INTRAVENOUS | Status: DC

## 2020-12-30 MED ORDER — DULOXETINE HCL 60 MG PO CPEP
60.0000 mg | ORAL_CAPSULE | Freq: Every day | ORAL | Status: DC
  Administered 2020-12-30 – 2021-01-02 (×4): 60 mg via ORAL
  Filled 2020-12-30: qty 2
  Filled 2020-12-30 (×3): qty 1

## 2020-12-30 MED ORDER — CARISOPRODOL 350 MG PO TABS
350.0000 mg | ORAL_TABLET | Freq: Three times a day (TID) | ORAL | Status: DC
  Administered 2020-12-30 – 2021-01-02 (×9): 350 mg via ORAL
  Filled 2020-12-30 (×9): qty 1

## 2020-12-30 MED ORDER — DICYCLOMINE HCL 20 MG PO TABS
20.0000 mg | ORAL_TABLET | Freq: Four times a day (QID) | ORAL | Status: DC | PRN
  Filled 2020-12-30: qty 1

## 2020-12-30 MED ORDER — HYDROMORPHONE HCL 1 MG/ML IJ SOLN
1.0000 mg | INTRAMUSCULAR | Status: DC | PRN
  Administered 2020-12-30 (×3): 1 mg via INTRAVENOUS
  Filled 2020-12-30 (×4): qty 1

## 2020-12-30 MED ORDER — CLONIDINE HCL 0.1 MG PO TABS
0.1000 mg | ORAL_TABLET | Freq: Once | ORAL | Status: AC
  Administered 2020-12-30: 0.1 mg via ORAL
  Filled 2020-12-30: qty 1

## 2020-12-30 MED ORDER — ACETAMINOPHEN 325 MG PO TABS
650.0000 mg | ORAL_TABLET | Freq: Four times a day (QID) | ORAL | Status: DC | PRN
  Administered 2020-12-31 – 2021-01-02 (×3): 650 mg via ORAL
  Filled 2020-12-30 (×3): qty 2

## 2020-12-30 MED ORDER — HYDROXYZINE HCL 25 MG PO TABS
25.0000 mg | ORAL_TABLET | Freq: Every day | ORAL | Status: DC | PRN
  Administered 2020-12-30 – 2021-01-02 (×4): 25 mg via ORAL
  Filled 2020-12-30 (×4): qty 1

## 2020-12-30 MED ORDER — ONDANSETRON HCL 4 MG/2ML IJ SOLN
4.0000 mg | Freq: Once | INTRAMUSCULAR | Status: AC
  Administered 2020-12-30: 4 mg via INTRAVENOUS
  Filled 2020-12-30: qty 2

## 2020-12-30 MED ORDER — MECLIZINE HCL 25 MG PO TABS
25.0000 mg | ORAL_TABLET | Freq: Three times a day (TID) | ORAL | Status: DC | PRN
  Filled 2020-12-30: qty 1

## 2020-12-30 MED ORDER — ENOXAPARIN SODIUM 40 MG/0.4ML IJ SOSY
40.0000 mg | PREFILLED_SYRINGE | INTRAMUSCULAR | Status: DC
  Administered 2020-12-30 – 2021-01-01 (×3): 40 mg via SUBCUTANEOUS
  Filled 2020-12-30 (×3): qty 0.4

## 2020-12-30 MED ORDER — ACETAMINOPHEN 650 MG RE SUPP: 650.0000 mg | Freq: Four times a day (QID) | RECTAL | Status: DC | PRN

## 2020-12-30 MED ORDER — IOHEXOL 350 MG/ML SOLN: 100.0000 mL | Freq: Once | INTRAVENOUS | Status: DC | PRN

## 2020-12-30 MED ORDER — BUPROPION HCL ER (XL) 150 MG PO TB24
150.0000 mg | ORAL_TABLET | Freq: Every morning | ORAL | Status: DC
  Administered 2020-12-30 – 2021-01-02 (×4): 150 mg via ORAL
  Filled 2020-12-30 (×4): qty 1

## 2020-12-30 MED ORDER — OXYCODONE HCL 5 MG PO TABS
5.0000 mg | ORAL_TABLET | ORAL | Status: DC | PRN
  Administered 2020-12-30 (×3): 5 mg via ORAL
  Filled 2020-12-30 (×3): qty 1

## 2020-12-30 MED ORDER — HYDROMORPHONE HCL 1 MG/ML IJ SOLN
1.0000 mg | Freq: Once | INTRAMUSCULAR | Status: AC
  Administered 2020-12-30: 1 mg via INTRAVENOUS
  Filled 2020-12-30: qty 1

## 2020-12-30 MED ORDER — CLONIDINE HCL 0.1 MG PO TABS
0.1000 mg | ORAL_TABLET | Freq: Two times a day (BID) | ORAL | Status: DC
  Administered 2020-12-30 – 2021-01-01 (×5): 0.1 mg via ORAL
  Filled 2020-12-30 (×5): qty 1

## 2020-12-30 MED ORDER — STERILE WATER FOR INJECTION IJ SOLN
INTRAMUSCULAR | Status: AC
  Filled 2020-12-30: qty 10

## 2020-12-30 MED ORDER — ZOLPIDEM TARTRATE 5 MG PO TABS
5.0000 mg | ORAL_TABLET | Freq: Every evening | ORAL | Status: DC | PRN
  Administered 2021-01-01: 5 mg via ORAL
  Filled 2020-12-30 (×3): qty 1

## 2020-12-30 MED ORDER — PROCHLORPERAZINE EDISYLATE 10 MG/2ML IJ SOLN
10.0000 mg | Freq: Four times a day (QID) | INTRAMUSCULAR | Status: DC | PRN
  Administered 2020-12-30 (×2): 10 mg via INTRAVENOUS
  Filled 2020-12-30 (×3): qty 2

## 2020-12-30 MED ORDER — LISINOPRIL 20 MG PO TABS: 20.0000 mg | ORAL_TABLET | Freq: Once | ORAL | Status: DC

## 2020-12-30 MED ORDER — HYDRALAZINE HCL 20 MG/ML IJ SOLN
5.0000 mg | Freq: Once | INTRAMUSCULAR | Status: AC
  Administered 2020-12-30: 5 mg via INTRAVENOUS
  Filled 2020-12-30: qty 1

## 2020-12-30 MED ORDER — CHLORHEXIDINE GLUCONATE CLOTH 2 % EX PADS
6.0000 | MEDICATED_PAD | Freq: Every day | CUTANEOUS | Status: DC
  Administered 2020-12-30 – 2021-01-02 (×4): 6 via TOPICAL

## 2020-12-30 MED ORDER — NICARDIPINE HCL IN NACL 20-0.86 MG/200ML-% IV SOLN
3.0000 mg/h | INTRAVENOUS | Status: DC
  Administered 2020-12-30: 3 mg/h via INTRAVENOUS
  Administered 2020-12-30: 5 mg/h via INTRAVENOUS
  Filled 2020-12-30 (×3): qty 200

## 2020-12-30 NOTE — ED Notes (Signed)
IP RN accepts pt.

## 2020-12-30 NOTE — ED Triage Notes (Signed)
Pt states that she thinks she has a UTI. Pt complains of urinary frequency and abdominal pain.

## 2020-12-30 NOTE — ED Notes (Signed)
Pt states she has 10/10 lower abdominal pain. Hina, PA made aware.

## 2020-12-30 NOTE — ED Notes (Signed)
Secure chat sent to IP nurse for handoff.  

## 2020-12-30 NOTE — ED Provider Notes (Signed)
[facility]-EMERGENCY DEPT Provider Note   CSN: [id number] Arrival date & time: 12/29/20  2222     History Chief Complaint  Patient presents with   Urinary Frequency   Abdominal Pain    Kathy Fischer is a 80 y.o. female.  The history is provided by the patient and medical records. No language interpreter was used.  Urinary Frequency Associated symptoms include abdominal pain.  Abdominal Pain  80 year old female significant history of hypertensions, Crohn's disease, presenting complaining of abdominal pain.  Patient report throughout the day today she has had diffuse abdominal discomfort.  She is unable to describe the pain but states that is very uncomfortable and after laying in a chair waiting in the waiting room for several hours it seems to make her pain even worse.  She also endorsed nausea well without vomiting.  She noticed increased urinary frequency without burning urination.  States that she was admitted in the hospital in May with symptoms similar to this and related to urinary tract infection.  She endorsed chills without any fever no chest pain or shortness of breath.  Last bowel movement was yesterday and it was normal.  her pain is moderate in severity.  Past Medical History:  Diagnosis Date   Crohn's disease (HCC)    Hypertension     Patient Active Problem List   Diagnosis Date Noted   Acute cystitis 09/12/2020   Nausea & vomiting 09/12/2020   Generalized weakness 09/12/2020   Abdominal pain 09/12/2020   Hyponatremia 09/12/2020   Hypertension    Acquired hypothyroidism     History reviewed. No pertinent surgical history.   OB History   No obstetric history on file.     History reviewed. No pertinent family history.  Social History   Tobacco Use   Smoking status: Never   Smokeless tobacco: Never  Substance Use Topics   Alcohol use: Not Currently   Drug use: Never    Home Medications Prior to Admission medications    Medication Sig Start Date End Date Taking? Authorizing Provider  buPROPion (WELLBUTRIN XL) 150 MG 24 hr tablet Take 1 tablet by mouth every morning. 05/02/20   [provider]  carisoprodol (SOMA) 350 MG tablet Take 350 mg by mouth 3 (three) times daily. 11/01/17   [provider]  cefdinir (OMNICEF) 300 MG capsule Take 1 capsule (300 mg total) by mouth 2 (two) times daily. 09/13/20   Alwyn Ren, MD  Cholecalciferol 1.25 MG (50000 UT) capsule Take 1 capsule by mouth 2 (two) times a week. 03/22/18   [provider]  dicyclomine (BENTYL) 20 MG tablet Take 1 tablet (20 mg total) by mouth every 6 (six) hours as needed. 09/13/20   Alwyn Ren, MD  DULoxetine (CYMBALTA) 60 MG capsule Take 1 capsule by mouth daily. 08/04/20   [provider]  gabapentin (NEURONTIN) 300 MG capsule Take 1 capsule by mouth 4 (four) times daily. 05/21/20   [provider]  hydrOXYzine (ATARAX/VISTARIL) 25 MG tablet Take 1 tablet by mouth daily as needed. May take up to 3 to 4 times daily 04/16/20   [provider]  levothyroxine (SYNTHROID) 75 MCG tablet Take 75 mcg by mouth daily. 01/15/20   [provider]  lisinopril (ZESTRIL) 20 MG tablet Take 20 mg by mouth daily. 07/02/20   [provider]  naloxone [facility]) nasal spray 4 mg/0.1 mL Place 1 spray into the nose once as needed. one spray by Nasal route once as needed  for up to 1 dose. Use per instruction in event of accidential over dose and call 911. 08/13/19   [provider]  omeprazole (PRILOSEC) 40 MG capsule Take 1 capsule by mouth daily. 05/12/20   [provider]  prochlorperazine (COMPAZINE) 5 MG tablet Take 1 tablet by mouth every 6 (six) hours as needed. 03/31/20   [provider]  rosuvastatin (CRESTOR) 20 MG tablet Take 20 mg by mouth daily. 08/04/20   [provider]  zolpidem (AMBIEN) 10 MG tablet Take 10 mg by mouth at bedtime as needed.  05/20/19   [provider]    Allergies    Dexamethasone, Penicillins, and Sulfa antibiotics  Review of Systems   Review of Systems  Gastrointestinal:  Positive for abdominal pain.  Genitourinary:  Positive for frequency.  All other systems reviewed and are negative.  Physical Exam Updated Vital Signs BP (!) 207/112 (BP Location: Left Arm) Comment: RN Notified  Pulse 89   Temp 97.9 F (36.6 C) (Oral)   Resp 18   SpO2 99%   Physical Exam Vitals and nursing note reviewed.  Constitutional:      General: She is not in acute distress.    Appearance: She is well-developed.  HENT:     Head: Atraumatic.  Eyes:     Conjunctiva/sclera: Conjunctivae normal.  Cardiovascular:     Rate and Rhythm: Normal rate and regular rhythm.     Heart sounds: Normal heart sounds.  Pulmonary:     Effort: Pulmonary effort is normal.  Abdominal:     General: Abdomen is flat. Bowel sounds are normal.     Palpations: Abdomen is soft.     Tenderness: There is abdominal tenderness (Diffuse abdominal tenderness without focal point tenderness.  No guarding or rebound tenderness).  Musculoskeletal:     Cervical back: Neck supple.  Skin:    Findings: No rash.  Neurological:     Mental Status: She is alert. Mental status is at baseline.  Psychiatric:        Mood and Affect: Mood normal.    ED Results / Procedures / Treatments   Labs (all labs ordered are listed, but only abnormal results are displayed) Labs Reviewed  URINALYSIS, ROUTINE W REFLEX MICROSCOPIC - Abnormal; Notable for the following components:      Result Value   Color, Urine STRAW (*)    Protein, ur 100 (*)    All other components within normal limits  BASIC METABOLIC PANEL - Abnormal; Notable for the following components:   Sodium 127 (*)    Chloride 97 (*)    CO2 19 (*)    Glucose, Bld 107 (*)    All other components within normal limits  CBC WITH DIFFERENTIAL/PLATELET - Abnormal; Notable for the following  components:   Neutro Abs 8.2 (*)    All other components within normal limits  URINE CULTURE    EKG EKG Interpretation  Date/Time:  Tuesday December 30 2020 01:00:21 EDT Ventricular Rate:  80 PR Interval:  171 QRS Duration: 139 QT Interval:  429 QTC Calculation: 495 R Axis:   -63 Text Interpretation: Sinus rhythm Right bundle branch block Inferior infarct, old Confirmed by Kennis Carina [phone]) on 12/30/2020 5:56:41 AM  Radiology CT ABDOMEN PELVIS W CONTRAST  Result Date: 12/30/2020 CLINICAL DATA:  Abdominal pain.  History of Crohn's disease. EXAM: CT ABDOMEN AND PELVIS WITH CONTRAST TECHNIQUE: Multidetector CT imaging of the abdomen and pelvis was performed using the standard protocol following bolus administration of  intravenous contrast. CONTRAST:  65mL OMNIPAQUE IOHEXOL 350 MG/ML SOLN COMPARISON:  None. FINDINGS: Lower chest: Coronary artery calcifications in the visualized left coronary artery. Aortic atherosclerosis. No acute abnormality. Small to moderate-sized hiatal hernia. Hepatobiliary: Cysts within the liver, the largest in the left hepatic lobe measuring 4 cm. Prior cholecystectomy. Pancreas: No focal abnormality or ductal dilatation. Spleen: No focal abnormality.  Normal size. Adrenals/Urinary Tract: No renal or adrenal mass. No hydronephrosis. Urinary bladder unremarkable. Stomach/Bowel: Stomach, large and small bowel grossly unremarkable. Vascular/Lymphatic: Calcified aorta and iliac vessels. No evidence of aneurysm or adenopathy. Reproductive: Prior hysterectomy.  No adnexal masses. Other: No free fluid or free air. Musculoskeletal: No acute bony abnormality. Scoliosis and degenerative changes in the lumbar spine. IMPRESSION: Small to moderate-sized hiatal hernia. Aortic atherosclerosis.  Coronary artery disease. No acute findings in the abdomen or pelvis. Electronically Signed   By: Charlett Nose M.D.   On: 12/30/2020 01:44   CT ANGIO CHEST AORTA W/CM & OR WO/CM  Result Date:  12/30/2020 CLINICAL DATA:  80 year old female with chest and back pain. EXAM: CT ANGIOGRAPHY CHEST WITH CONTRAST TECHNIQUE: Multidetector CT imaging of the chest was performed using the standard protocol during bolus administration of intravenous contrast. Multiplanar CT image reconstructions and MIPs were obtained to evaluate the vascular anatomy. CONTRAST:  17mL OMNIPAQUE IOHEXOL 350 MG/ML SOLN COMPARISON:  CT Abdomen and Pelvis 0131 hours today. FINDINGS: Cardiovascular: Good contrast bolus timing in the pulmonary arterial tree. Respiratory motion in the lower lobes. This degrades detail of some subsegmental lower lobe branches. But there is no convincing filling defect identified in the pulmonary arteries to suggest acute pulmonary embolism. Moderate aortic atherosclerosis. Negative for thoracic aortic aneurysm or dissection. Tortuous proximal great vessels. Calcified coronary artery atherosclerosis. Mild cardiomegaly. No pericardial effusion. Mediastinum/Nodes: No mediastinal lymphadenopathy. Lungs/Pleura: Atelectatic changes to the major airways which remain patent. Diffuse crowding of lung markings and mild dependent atelectasis bilaterally. No consolidation. No pleural effusion. No convincing confluent or inflammatory appearing lung opacity. Atelectasis also associated with the moderate size gastric hiatal hernia described earlier today. Upper Abdomen: Stable to the CT Abdomen and Pelvis at 0131 hours today. Musculoskeletal: Degenerative changes in the thoracic spine. No acute osseous abnormality identified. Review of the MIP images confirms the above findings. IMPRESSION: 1. Motion artifact in the lower lobes but no convincing acute pulmonary embolus. 2. Aortic Atherosclerosis (ICD10-I70.0). Negative for thoracic aortic dissection or aneurysm. 3. Atelectasis, but no other acute pulmonary finding. 4. Calcified coronary artery atherosclerosis and mild cardiomegaly. 5. Moderate size gastric hiatal hernia as  described by CT Abdomen and Pelvis earlier today. Electronically Signed   By: Odessa Fleming M.D.   On: 12/30/2020 05:29    Procedures Procedures   Medications Ordered in ED Medications  cloNIDine (CATAPRES) tablet 0.1 mg (0.1 mg Oral Given 12/30/20 0052)  HYDROmorphone (DILAUDID) injection 1 mg (1 mg Intravenous Given 12/30/20 0112)  ondansetron (ZOFRAN) injection 4 mg (4 mg Intravenous Given 12/30/20 0110)  iohexol (OMNIPAQUE) 350 MG/ML injection 100 mL (80 mLs Intravenous Contrast Given 12/30/20 0126)  HYDROmorphone (DILAUDID) injection 1 mg (1 mg Intravenous Given 12/30/20 0307)  iohexol (OMNIPAQUE) 350 MG/ML injection 100 mL (80 mLs Intravenous Contrast Given 12/30/20 0439)  HYDROmorphone (DILAUDID) injection 1 mg (1 mg Intravenous Given 12/30/20 0551)  ondansetron (ZOFRAN) injection 4 mg (4 mg Intravenous Given 12/30/20 0600)    ED Course  I have reviewed the triage vital signs and the nursing notes.  Pertinent labs & imaging results that were available  during my care of the patient were reviewed by me and considered in my medical decision making (see chart for details).    MDM Rules/Calculators/A&P                           BP (!) 207/120   Pulse 89   Temp 97.9 F (36.6 C) (Oral)   Resp 12   SpO2 96%   Final Clinical Impression(s) / ED Diagnoses Final diagnoses:  None    Rx / DC Orders ED Discharge Orders     None      12:53 AM Patient with history of Crohn's disease here who is here complaining of abdominal pain as well as urinary frequency for 1 day.  She has diffuse abdominal tenderness without guarding or rebound tenderness.  Given her age, will obtain abdominal and pelvic CT scan for further evaluation.  UA obtained without signs of urine tract infection.  Urine culture sent.  Patient was noted to be hypertensive with a blood pressure of 207/112.  She reports she has not had her blood pressure medication today since being in the ER.  She also endorsed having chronic back pain that  she normally takes hydromorphone at home and felt back pain has increased due to the of uncomfortable chair that she is currently sitting on.  Elevated blood pressure is likely secondary to pain and not on her medication.  Will provide symptomatic treatment.  Will monitor closely.  5:36 AM Labs remarkable for sodium of 127, patient is currently receiving IV fluid resuscitation.  She has normal WBC normal hemoglobin.  Chest abdomen pelvis CT scan without acute finding.  No evidence to suggest PE or dissection.  There is a moderate size gastric hiatal hernia which were noted.  Plan patient is neurovascular intact in bed on reexamination she still has diffuse abdominal tenderness.  She did receive multiple dose of Dilaudid here for pain control.  She does take Dilaudid p.o. at home.  Her back pain is chronic in nature I have low suspicion for spinal infection, fracture or dislocation.  Patient remains hypertensive with systolic of 197.  Will provide additional pain management.  I did offer admission for pain control and treatment of her low sodium however patient states she does have Dilaudid p.o. at home available and she does not want to leave her husband at home by himself.  CAre discussed with Dr. Pilar Plate.    6:51 AM Pt sign out to oncoming provider who will reassess pt and f/u on trop.  May need admission of sxs not control or if trop elevated.     Fayrene Helper, PA-C 12/30/20 4010    Sabas Sous, MD 12/30/20 [phone]

## 2020-12-30 NOTE — H&P (Signed)
History and Physical    Kathy Fischer DXI:[id number] DOB: 03/22/1941 DOA: 12/29/2020  PCP: Renelda Loma., MD  Patient coming from: home  Chief Complaint: lower abdominal pain  HPI: Kathy Fischer is a 80 y.o. female with medical history significant of Crohn's, HTN. Presenting with lower abdominal pain. Symptoms started yesterday afternoon. It's a constant pain of unspecified character. She was unable to take her chronic pain meds d/t nausea. She had not vomiting or diarrhea. Her symptoms worsened through the night, so she decided to come to the ED for help. She denies any other aggravating or alleviating factors.   ED Course: CT ab/pelvis was negative for source. She was given pain meds for the abdominal pain w/ little improvement. Her systolic BP was as high as 230. She was given 0.1mg  of clonidine and 5mg  of hydralazine for that elevated BP. It did not resolve. [facility] was called for admission.   Review of Systems:  Denies CP, dyspnea, palpitation, vomiting, fevers, diarrhea. Review of systems is otherwise negative for all not mentioned in HPI.   PMHx Past Medical History:  Diagnosis Date   Crohn's disease (HCC)    Hypertension     PSHx History reviewed. No pertinent surgical history.  SocHx  reports that she has never smoked. She has never used smokeless tobacco. She reports previous alcohol use. She reports that she does not use drugs.  Allergies  Allergen Reactions   Dexamethasone Hypertension    Large doses   Penicillins Rash   Sulfa Antibiotics Other (See Comments)    Unknown    FamHx History reviewed. No pertinent family history.  Prior to Admission medications   Medication Sig Start Date End Date Taking? Authorizing Provider  buPROPion (WELLBUTRIN XL) 150 MG 24 hr tablet Take 1 tablet by mouth every morning. 05/02/20  Yes [provider]  carisoprodol (SOMA) 350 MG tablet Take 350 mg by mouth 3 (three) times daily. 11/01/17  Yes [provider]   Cholecalciferol 1.25 MG (50000 UT) capsule Take 1 capsule by mouth 2 (two) times a week. 03/22/18  Yes [provider]  cloNIDine (CATAPRES) 0.1 MG tablet Take 0.1 mg by mouth 2 (two) times daily. 10/15/20  Yes [provider]  dicyclomine (BENTYL) 20 MG tablet Take 1 tablet (20 mg total) by mouth every 6 (six) hours as needed. Patient taking differently: Take 20 mg by mouth every 6 (six) hours as needed for spasms. 09/13/20  Yes 09/15/20, MD  DULoxetine (CYMBALTA) 60 MG capsule Take 1 capsule by mouth daily. 08/04/20  Yes [provider]  gabapentin (NEURONTIN) 300 MG capsule Take 1 capsule by mouth 4 (four) times daily. 05/21/20  Yes [provider]  HYDROmorphone (DILAUDID) 2 MG tablet Take 2 mg by mouth every 6 (six) hours as needed for pain. 11/21/20 01/16/21 Yes [provider]  hydrOXYzine (ATARAX/VISTARIL) 25 MG tablet Take 1 tablet by mouth daily as needed for anxiety. May take up to 3 to 4 times daily 04/16/20  Yes [provider]  levothyroxine (SYNTHROID) 75 MCG tablet Take 75 mcg by mouth daily. 01/15/20  Yes [provider]  lisinopril (ZESTRIL) 20 MG tablet Take 20 mg by mouth daily. 07/02/20  Yes [provider]  meclizine (ANTIVERT) 25 MG tablet Take 25 mg by mouth 3 (three) times daily as needed for dizziness. 10/09/20 10/09/21 Yes [provider]  naloxone (NARCAN) nasal spray 4 mg/0.1 mL Place 1 spray into the nose once as needed (overdose). one  spray by Nasal route once as needed for up to 1 dose. Use per instruction in event of accidential over dose and call 911. 08/13/19  Yes [provider]  omeprazole (PRILOSEC) 40 MG capsule Take 1 capsule by mouth daily. 05/12/20  Yes [provider]  prochlorperazine (COMPAZINE) 5 MG tablet Take 1 tablet by mouth every 6 (six) hours as needed for nausea or vomiting. 03/31/20  Yes [provider]  rosuvastatin (CRESTOR) 20 MG tablet  Take 20 mg by mouth daily. 08/04/20  Yes [provider]  zolpidem (AMBIEN) 10 MG tablet Take 10 mg by mouth at bedtime as needed for sleep. 05/20/19  Yes [provider]  cefdinir (OMNICEF) 300 MG capsule Take 1 capsule (300 mg total) by mouth 2 (two) times daily. Patient not taking: No sig reported 09/13/20   Alwyn Ren, MD    Physical Exam: Vitals:   12/30/20 0753 12/30/20 0754 12/30/20 0800 12/30/20 0800  BP: (!) 204/117 (!) 204/117 (!) 203/120   Pulse:  88 86   Resp:  16 12   Temp:    97.9 F (36.6 C)  TempSrc:    Oral  SpO2:  97% 96%     General: 80 y.o. female resting in bed in NAD Eyes: PERRL, normal sclera ENMT: Nares patent w/o discharge, orophaynx clear, dentition normal, ears w/o discharge/lesions/ulcers Neck: Supple, trachea midline Cardiovascular: tachy, +S1, S2, no m/g/r, equal pulses throughout Respiratory: CTABL, no w/r/r, normal WOB GI: BS+, ND, diffuse tenderness, no masses noted, no organomegaly noted MSK: No e/c/c Skin: No rashes, bruises, ulcerations noted Neuro: A&O x 3, no focal deficits Psyc: Appropriate interaction and affect, calm/cooperative  Labs on Admission: I have personally reviewed following labs and imaging studies  CBC: Recent Labs  Lab 12/29/20 2328  WBC 10.3  NEUTROABS 8.2*  HGB 12.6  HCT 37.8  MCV 92.6  PLT 312   Basic Metabolic Panel: Recent Labs  Lab 12/29/20 2328  NA 127*  K 4.3  CL 97*  CO2 19*  GLUCOSE 107*  BUN 20  CREATININE 0.89  CALCIUM 9.3   GFR: CrCl cannot be calculated (Unknown ideal weight.). Liver Function Tests: No results for input(s): AST, ALT, ALKPHOS, BILITOT, PROT, ALBUMIN in the last 168 hours. No results for input(s): LIPASE, AMYLASE in the last 168 hours. No results for input(s): AMMONIA in the last 168 hours. Coagulation Profile: No results for input(s): INR, PROTIME in the last 168 hours. Cardiac Enzymes: No results for input(s): CKTOTAL, CKMB, CKMBINDEX,  TROPONINI in the last 168 hours. BNP (last 3 results) No results for input(s): PROBNP in the last 8760 hours. HbA1C: No results for input(s): HGBA1C in the last 72 hours. CBG: No results for input(s): GLUCAP in the last 168 hours. Lipid Profile: No results for input(s): CHOL, HDL, LDLCALC, TRIG, CHOLHDL, LDLDIRECT in the last 72 hours. Thyroid Function Tests: No results for input(s): TSH, T4TOTAL, FREET4, T3FREE, THYROIDAB in the last 72 hours. Anemia Panel: No results for input(s): VITAMINB12, FOLATE, FERRITIN, TIBC, IRON, RETICCTPCT in the last 72 hours. Urine analysis:    Component Value Date/Time   COLORURINE STRAW (A) 12/29/2020 2226   APPEARANCEUR CLEAR 12/29/2020 2226   LABSPEC 1.012 12/29/2020 2226   PHURINE 6.0 12/29/2020 2226   GLUCOSEU NEGATIVE 12/29/2020 2226   HGBUR NEGATIVE 12/29/2020 2226   BILIRUBINUR NEGATIVE 12/29/2020 2226   KETONESUR NEGATIVE 12/29/2020 2226   PROTEINUR 100 (A) 12/29/2020 2226   NITRITE NEGATIVE 12/29/2020 2226   LEUKOCYTESUR NEGATIVE 12/29/2020  2226    Radiological Exams on Admission: CT ABDOMEN PELVIS W CONTRAST  Result Date: 12/30/2020 CLINICAL DATA:  Abdominal pain.  History of Crohn's disease. EXAM: CT ABDOMEN AND PELVIS WITH CONTRAST TECHNIQUE: Multidetector CT imaging of the abdomen and pelvis was performed using the standard protocol following bolus administration of intravenous contrast. CONTRAST:  80mL OMNIPAQUE IOHEXOL 350 MG/ML SOLN COMPARISON:  None. FINDINGS: Lower chest: Coronary artery calcifications in the visualized left coronary artery. Aortic atherosclerosis. No acute abnormality. Small to moderate-sized hiatal hernia. Hepatobiliary: Cysts within the liver, the largest in the left hepatic lobe measuring 4 cm. Prior cholecystectomy. Pancreas: No focal abnormality or ductal dilatation. Spleen: No focal abnormality.  Normal size. Adrenals/Urinary Tract: No renal or adrenal mass. No hydronephrosis. Urinary bladder unremarkable.  Stomach/Bowel: Stomach, large and small bowel grossly unremarkable. Vascular/Lymphatic: Calcified aorta and iliac vessels. No evidence of aneurysm or adenopathy. Reproductive: Prior hysterectomy.  No adnexal masses. Other: No free fluid or free air. Musculoskeletal: No acute bony abnormality. Scoliosis and degenerative changes in the lumbar spine. IMPRESSION: Small to moderate-sized hiatal hernia. Aortic atherosclerosis.  Coronary artery disease. No acute findings in the abdomen or pelvis. Electronically Signed   By: Charlett Nose M.D.   On: 12/30/2020 01:44   CT ANGIO CHEST AORTA W/CM & OR WO/CM  Result Date: 12/30/2020 CLINICAL DATA:  80 year old female with chest and back pain. EXAM: CT ANGIOGRAPHY CHEST WITH CONTRAST TECHNIQUE: Multidetector CT imaging of the chest was performed using the standard protocol during bolus administration of intravenous contrast. Multiplanar CT image reconstructions and MIPs were obtained to evaluate the vascular anatomy. CONTRAST:  54mL OMNIPAQUE IOHEXOL 350 MG/ML SOLN COMPARISON:  CT Abdomen and Pelvis 0131 hours today. FINDINGS: Cardiovascular: Good contrast bolus timing in the pulmonary arterial tree. Respiratory motion in the lower lobes. This degrades detail of some subsegmental lower lobe branches. But there is no convincing filling defect identified in the pulmonary arteries to suggest acute pulmonary embolism. Moderate aortic atherosclerosis. Negative for thoracic aortic aneurysm or dissection. Tortuous proximal great vessels. Calcified coronary artery atherosclerosis. Mild cardiomegaly. No pericardial effusion. Mediastinum/Nodes: No mediastinal lymphadenopathy. Lungs/Pleura: Atelectatic changes to the major airways which remain patent. Diffuse crowding of lung markings and mild dependent atelectasis bilaterally. No consolidation. No pleural effusion. No convincing confluent or inflammatory appearing lung opacity. Atelectasis also associated with the moderate size gastric  hiatal hernia described earlier today. Upper Abdomen: Stable to the CT Abdomen and Pelvis at 0131 hours today. Musculoskeletal: Degenerative changes in the thoracic spine. No acute osseous abnormality identified. Review of the MIP images confirms the above findings. IMPRESSION: 1. Motion artifact in the lower lobes but no convincing acute pulmonary embolus. 2. Aortic Atherosclerosis (ICD10-I70.0). Negative for thoracic aortic dissection or aneurysm. 3. Atelectasis, but no other acute pulmonary finding. 4. Calcified coronary artery atherosclerosis and mild cardiomegaly. 5. Moderate size gastric hiatal hernia as described by CT Abdomen and Pelvis earlier today. Electronically Signed   By: Odessa Fleming M.D.   On: 12/30/2020 05:29    EKG: Independently reviewed. Sinus, RBBB; no st elevations  Assessment/Plan HTN urgency     - admit to inpt, SDU     - start cardene gtt     - resume home meds when able     - pain control  Abdominal pain Hx of crohns     - CT ab/pelvis w/ contrast is negative     - UA and lab work are negative     - pain control     -  continue home regimen  Hyponatremia     - fluids, q6h BMP  Anxiety     - continue home regimen  Non-cap metabolic acidosis     - check lactic acid     - fluids, follow  Nausea     - antiemetics  DVT prophylaxis: lovenox  Code Status: FULL  Family Communication: None at bedside  Consults called: None   Status is: Inpatient  Remains inpatient appropriate because:Inpatient level of care appropriate due to severity of illness  Dispo: The patient is from: Home              Anticipated d/c is to: Home              Patient currently is not medically stable to d/c.   Difficult to place patient No  Time spent coordinating admission: 70 minutes  Athziri Freundlich A Cloey Sferrazza DO [facility]  If 7PM-7AM, please contact night-coverage [email or web address]  12/30/2020, 8:10 AM

## 2020-12-30 NOTE — ED Provider Notes (Signed)
Physical Exam  BP (!) 207/120   Pulse 89   Temp 97.9 F (36.6 C) (Oral)   Resp 12   SpO2 96%   Physical Exam Vitals and nursing note reviewed.  Constitutional:      General: She is not in acute distress.    Appearance: She is well-developed. She is not diaphoretic.  HENT:     Head: Normocephalic and atraumatic.  Eyes:     General: No scleral icterus.    Conjunctiva/sclera: Conjunctivae normal.  Pulmonary:     Effort: Pulmonary effort is normal. No respiratory distress.  Musculoskeletal:     Cervical back: Normal range of motion.  Skin:    Findings: No rash.  Neurological:     Mental Status: She is alert.    ED Course/Procedures   Clinical Course as of 12/30/20 0812  Tue Dec 30, 2020  0736 BP(!): 212/121 I have ordered hydralazine. [HK]  0746 Sodium(!): 127 [HK]    Clinical Course User Index [HK] Kathy Ebner, PA-C    .Critical Care  Date/Time: 12/30/2020 8:03 AM Performed by: Dietrich Pates, PA-C Authorized by: Dietrich Pates, PA-C   Critical care provider statement:    Critical care time (minutes):  35   Critical care time was exclusive of:  Separately billable procedures and treating other patients and teaching time   Critical care was necessary to treat or prevent imminent or life-threatening deterioration of the following conditions:  Circulatory failure and cardiac failure   Critical care was time spent personally by me on the following activities:  Development of treatment plan with patient or surrogate, discussions with consultants, evaluation of patient's response to treatment, obtaining history from patient or surrogate, ordering and performing treatments and interventions, ordering and review of laboratory studies, re-evaluation of patient's condition, review of old charts and examination of patient   I assumed direction of critical care for this patient from another provider in my specialty: no     Care discussed with: admitting provider    MDM   Care of  patient assumed from PA [place] at 7 AM.  Agree with history, physical exam and plan.  See their note for further details.  Briefly, 80 y.o. female with PMH/PSH as below who presents with diffuse abdominal discomfort.  Reports urinary frequency without burning.  Similar symptoms related to a UTI a few months ago.  Pain is not controlled with her home pain medication.  No chest pain or shortness of breath. UA here without evidence of UTI. CT of the chest, abdomen pelvis without any acute findings.  No leukocytosis or other abnormalities in CBC.   She does have hyponatremia at 127 which is new for her.   She is persistently hypertensive here to above 200 systolic. Attempted 3 rounds of Dilaudid to help with her pain but she continues to be symptomatic.  Nausea is controlled. Given clonidine for hypertension.  Past Medical History:  Diagnosis Date   Crohn's disease (HCC)    Hypertension    History reviewed. No pertinent surgical history.    Current Plan: She is pending a troponin and recheck after her latest dose of pain medication.  If she continues to be symptomatic and/or hypertensive will consider admission.   MDM/ED Course: On recheck patient continues to be hypertensive.  Her troponin is negative x1. She is requesting more pain medication.  I had a discussion with her regarding the next step in management, admission versus discharge home based on her comfort level.  She had a  discussion with her husband but was still indecisive about whether she wanted to be admitted to the hospital.  She finally agreed for admission as her pain is not able to be controlled.  I have given her hydralazine to help with her hypertension here.  She will require admission for hyponatremia, persistent pain and hypertensive urgency.  Consults: Hospitalist Dr. Ronaldo Miyamoto   Significant labs/images: Labs Reviewed  URINALYSIS, ROUTINE W REFLEX MICROSCOPIC - Abnormal; Notable for the following components:      Result  Value   Color, Urine STRAW (*)    Protein, ur 100 (*)    All other components within normal limits  BASIC METABOLIC PANEL - Abnormal; Notable for the following components:   Sodium 127 (*)    Chloride 97 (*)    CO2 19 (*)    Glucose, Bld 107 (*)    All other components within normal limits  CBC WITH DIFFERENTIAL/PLATELET - Abnormal; Notable for the following components:   Neutro Abs 8.2 (*)    All other components within normal limits  URINE CULTURE  RESP PANEL BY RT-PCR (FLU A&B, COVID) [id number]  TROPONIN I (HIGH SENSITIVITY)  TROPONIN I (HIGH SENSITIVITY)      I personally reviewed and interpreted all labs.      Dietrich Pates, PA-C 12/30/20 0938    Bethann Berkshire, MD 01/04/21 [phone]

## 2020-12-31 DIAGNOSIS — E871 Hypo-osmolality and hyponatremia: Secondary | ICD-10-CM

## 2020-12-31 LAB — URINE CULTURE: Culture: <10000 — AB

## 2020-12-31 LAB — RENAL FUNCTION PANEL
Albumin: 4 g/dL (ref 3.5–5.0)
Anion gap: 11 (ref 5–15)
BUN: 24 mg/dL — ABNORMAL HIGH (ref 8–23)
CO2: 17 mmol/L — ABNORMAL LOW (ref 22–32)
Calcium: 9.1 mg/dL (ref 8.9–10.3)
Chloride: 104 mmol/L (ref 98–111)
Creatinine, Ser: 0.93 mg/dL (ref 0.44–1.00)
GFR, Estimated: >60 mL/min (ref >60)
Glucose, Bld: 83 mg/dL (ref 70–99)
Phosphorus: 4.2 mg/dL (ref 2.5–4.6)
Potassium: 4 mmol/L (ref 3.5–5.1)
Sodium: 132 mmol/L — ABNORMAL LOW (ref 135–145)

## 2020-12-31 LAB — CBC
HCT: 40.6 % (ref 36.0–46.0)
Hemoglobin: 12.9 g/dL (ref 12.0–15.0)
MCH: 30.8 pg (ref 26.0–34.0)
MCHC: 31.8 g/dL (ref 30.0–36.0)
MCV: 96.9 fL (ref 80.0–100.0)
Platelets: 290 10*3/uL (ref 150–400)
RBC: 4.19 MIL/uL (ref 3.87–5.11)
RDW: 12.9 % (ref 11.5–15.5)
WBC: 11.2 10*3/uL — ABNORMAL HIGH (ref 4.0–10.5)
nRBC: 0 % (ref 0.0–0.2)

## 2020-12-31 MED ORDER — ORAL CARE MOUTH RINSE
15.0000 mL | Freq: Two times a day (BID) | OROMUCOSAL | Status: DC
  Administered 2020-12-31 – 2021-01-02 (×5): 15 mL via OROMUCOSAL

## 2020-12-31 MED ORDER — POLYETHYLENE GLYCOL 3350 17 G PO PACK
17.0000 g | PACK | Freq: Two times a day (BID) | ORAL | Status: AC
  Administered 2020-12-31 (×2): 17 g via ORAL
  Filled 2020-12-31 (×2): qty 1

## 2020-12-31 MED ORDER — PANTOPRAZOLE SODIUM 40 MG PO TBEC
40.0000 mg | DELAYED_RELEASE_TABLET | Freq: Every day | ORAL | Status: DC
  Administered 2020-12-31 – 2021-01-02 (×3): 40 mg via ORAL
  Filled 2020-12-31 (×3): qty 1

## 2020-12-31 MED ORDER — LEVOTHYROXINE SODIUM 50 MCG PO TABS
75.0000 ug | ORAL_TABLET | Freq: Every day | ORAL | Status: DC
  Administered 2020-12-31 – 2021-01-02 (×3): 75 ug via ORAL
  Filled 2020-12-31 (×3): qty 1

## 2020-12-31 MED ORDER — HYDROMORPHONE HCL 2 MG PO TABS
2.0000 mg | ORAL_TABLET | Freq: Four times a day (QID) | ORAL | Status: DC | PRN
  Administered 2021-01-01 – 2021-01-02 (×2): 2 mg via ORAL
  Filled 2020-12-31 (×2): qty 1

## 2020-12-31 NOTE — Progress Notes (Signed)
[facility] PROGRESS NOTE    Progress Note  Kathy Fischer  WYO:[id number] DOB: April 08, 1941 DOA: 12/29/2020 PCP: Renelda Loma., MD     Brief Narrative:   Kathy Fischer is an 80 y.o. female past medical history significant of Crohn's disease and hypertension resents with lower abdominal pain, at home she was unable to take her medications due to nausea and vomiting.  CT scan of the abdomen pelvis in the ED showed no acute findings systolic blood pressure was in the 230s she was started on IV medication   Assessment/Plan:   Hypertensive urgency: Currently on Cardene drip significantly improved. Now of cardene drip antihypertensive medication orally resume blood pressure is well controlled and wean off Cardene drip. Transfer to MedSurg.  History of Crohn's disease abdominal pain: CT scan of the abdomen pelvis showed no acute findings UA showed no signs of infection. Electrolytes stable leukocytosis improved. She relates her abdominal pain is resolved this morning going to give her diet and advance as tolerated.  Mild non-anion gap metabolic acidosis: Continue IV fluids in the setting of nausea and vomiting continue antiemetics.  Mild hyponatremia: Started on IV fluids slightly improved. Monitor strict I's and O's  Anxiety: Continue current home meds.  DVT prophylaxis: lovenox Family Communication:husband Status is: Inpatient  Remains inpatient appropriate because:Hemodynamically unstable  Dispo: The patient is from: Home              Anticipated d/c is to: Home              Patient currently is not medically stable to d/c.   Difficult to place patient No        Code Status:     Code Status Orders  (From admission, onward)           Start     Ordered   12/30/20 1100  Full code  Continuous        12/30/20 1059           Code Status History     Date Active Date Inactive Code Status Order ID Comments User Context   09/12/2020 0251  09/13/2020 1936 Full Code [id number]  Angie Fava, DO ED         IV Access:   Peripheral IV   Procedures and diagnostic studies:   CT ABDOMEN PELVIS W CONTRAST  Result Date: 12/30/2020 CLINICAL DATA:  Abdominal pain.  History of Crohn's disease. EXAM: CT ABDOMEN AND PELVIS WITH CONTRAST TECHNIQUE: Multidetector CT imaging of the abdomen and pelvis was performed using the standard protocol following bolus administration of intravenous contrast. CONTRAST:  62mL OMNIPAQUE IOHEXOL 350 MG/ML SOLN COMPARISON:  None. FINDINGS: Lower chest: Coronary artery calcifications in the visualized left coronary artery. Aortic atherosclerosis. No acute abnormality. Small to moderate-sized hiatal hernia. Hepatobiliary: Cysts within the liver, the largest in the left hepatic lobe measuring 4 cm. Prior cholecystectomy. Pancreas: No focal abnormality or ductal dilatation. Spleen: No focal abnormality.  Normal size. Adrenals/Urinary Tract: No renal or adrenal mass. No hydronephrosis. Urinary bladder unremarkable. Stomach/Bowel: Stomach, large and small bowel grossly unremarkable. Vascular/Lymphatic: Calcified aorta and iliac vessels. No evidence of aneurysm or adenopathy. Reproductive: Prior hysterectomy.  No adnexal masses. Other: No free fluid or free air. Musculoskeletal: No acute bony abnormality. Scoliosis and degenerative changes in the lumbar spine. IMPRESSION: Small to moderate-sized hiatal hernia. Aortic atherosclerosis.  Coronary artery disease. No acute findings in the abdomen or pelvis. Electronically Signed   By: Charlett Nose M.D.  On: 12/30/2020 01:44   CT ANGIO CHEST AORTA W/CM & OR WO/CM  Result Date: 12/30/2020 CLINICAL DATA:  80 year old female with chest and back pain. EXAM: CT ANGIOGRAPHY CHEST WITH CONTRAST TECHNIQUE: Multidetector CT imaging of the chest was performed using the standard protocol during bolus administration of intravenous contrast. Multiplanar CT image reconstructions and MIPs  were obtained to evaluate the vascular anatomy. CONTRAST:  60mL OMNIPAQUE IOHEXOL 350 MG/ML SOLN COMPARISON:  CT Abdomen and Pelvis 0131 hours today. FINDINGS: Cardiovascular: Good contrast bolus timing in the pulmonary arterial tree. Respiratory motion in the lower lobes. This degrades detail of some subsegmental lower lobe branches. But there is no convincing filling defect identified in the pulmonary arteries to suggest acute pulmonary embolism. Moderate aortic atherosclerosis. Negative for thoracic aortic aneurysm or dissection. Tortuous proximal great vessels. Calcified coronary artery atherosclerosis. Mild cardiomegaly. No pericardial effusion. Mediastinum/Nodes: No mediastinal lymphadenopathy. Lungs/Pleura: Atelectatic changes to the major airways which remain patent. Diffuse crowding of lung markings and mild dependent atelectasis bilaterally. No consolidation. No pleural effusion. No convincing confluent or inflammatory appearing lung opacity. Atelectasis also associated with the moderate size gastric hiatal hernia described earlier today. Upper Abdomen: Stable to the CT Abdomen and Pelvis at 0131 hours today. Musculoskeletal: Degenerative changes in the thoracic spine. No acute osseous abnormality identified. Review of the MIP images confirms the above findings. IMPRESSION: 1. Motion artifact in the lower lobes but no convincing acute pulmonary embolus. 2. Aortic Atherosclerosis (ICD10-I70.0). Negative for thoracic aortic dissection or aneurysm. 3. Atelectasis, but no other acute pulmonary finding. 4. Calcified coronary artery atherosclerosis and mild cardiomegaly. 5. Moderate size gastric hiatal hernia as described by CT Abdomen and Pelvis earlier today. Electronically Signed   By: Odessa Fleming M.D.   On: 12/30/2020 05:29     Medical Consultants:   None.   Subjective:    Kathy Fischer relates her abdominal pain is resolved, she would like to try her diet has not had a bowel movement in 2  days.  Objective:    Vitals:   12/31/20 0334 12/31/20 0400 12/31/20 0500 12/31/20 0600  BP:  (!) 152/93 (!) 159/87 (!) 146/93  Pulse:    78  Resp:  12 11 (!) 30  Temp: 98.2 F (36.8 C)     TempSrc: Oral     SpO2:    97%  Weight:      Height:       SpO2: 97 % O2 Flow Rate (L/min): 2 L/min   Intake/Output Summary (Last 24 hours) at 12/31/2020 0658 Last data filed at 12/31/2020 0500 Gross per 24 hour  Intake 1769.09 ml  Output 600 ml  Net 1169.09 ml   Filed Weights   12/30/20 1100  Weight: 71.9 kg    Exam: General exam: In no acute distress. Respiratory system: Good air movement and clear to auscultation. Cardiovascular system: S1 & S2 heard, RRR. No JVD. Gastrointestinal system: Abdomen is nondistended, soft and nontender.  Extremities: No pedal edema. Skin: No rashes, lesions or ulcers Psychiatry: Judgement and insight appear normal. Mood & affect appropriate.    Data Reviewed:    Labs: Basic Metabolic Panel: Recent Labs  Lab 12/29/20 2328 12/30/20 1151 12/30/20 1916 12/30/20 2302 12/31/20 0551  NA 127* 132* 131* 132* 132*  K 4.3 3.6 4.2 4.3 4.0  CL 97* 100 100 99 104  CO2 19* 17* 20* 20* 17*  GLUCOSE 107* 130* 130* 107* 83  BUN 20 18 22 23  24*  CREATININE 0.89 0.82  0.87  1.09* 1.04* 0.93  CALCIUM 9.3 9.6 8.9 9.1 9.1  PHOS  --  2.7 4.2 4.6 4.2   GFR Estimated Creatinine Clearance: 46.1 mL/min (by C-G formula based on SCr of 0.93 mg/dL). Liver Function Tests: Recent Labs  Lab 12/30/20 1151 12/30/20 1916 12/30/20 2302 12/31/20 0551  ALBUMIN 4.2 4.0 4.3 4.0   No results for input(s): LIPASE, AMYLASE in the last 168 hours. No results for input(s): AMMONIA in the last 168 hours. Coagulation profile No results for input(s): INR, PROTIME in the last 168 hours. COVID-19 Labs  No results for input(s): DDIMER, FERRITIN, LDH, CRP in the last 72 hours.  Lab Results  Component Value Date   SARSCOV2NAA NEGATIVE 12/30/2020   SARSCOV2NAA NEGATIVE  09/12/2020    CBC: Recent Labs  Lab 12/29/20 2328 12/30/20 1151 12/31/20 0551  WBC 10.3 14.4* 11.2*  NEUTROABS 8.2*  --   --   HGB 12.6 13.3 12.9  HCT 37.8 38.7 40.6  MCV 92.6 90.6 96.9  PLT 312 345 290   Cardiac Enzymes: No results for input(s): CKTOTAL, CKMB, CKMBINDEX, TROPONINI in the last 168 hours. BNP (last 3 results) No results for input(s): PROBNP in the last 8760 hours. CBG: No results for input(s): GLUCAP in the last 168 hours. D-Dimer: No results for input(s): DDIMER in the last 72 hours. Hgb A1c: No results for input(s): HGBA1C in the last 72 hours. Lipid Profile: No results for input(s): CHOL, HDL, LDLCALC, TRIG, CHOLHDL, LDLDIRECT in the last 72 hours. Thyroid function studies: No results for input(s): TSH, T4TOTAL, T3FREE, THYROIDAB in the last 72 hours.  Invalid input(s): FREET3 Anemia work up: No results for input(s): VITAMINB12, FOLATE, FERRITIN, TIBC, IRON, RETICCTPCT in the last 72 hours. Sepsis Labs: Recent Labs  Lab 12/29/20 2328 12/30/20 0923 12/30/20 1151 12/31/20 0551  WBC 10.3  --  14.4* 11.2*  LATICACIDVEN  --  1.2 1.4  --    Microbiology Recent Results (from the past 240 hour(s))  Resp Panel by RT-PCR (Flu A&B, Covid) Nasopharyngeal Swab     Status: None   Collection Time: 12/30/20  7:57 AM   Specimen: Nasopharyngeal Swab; Nasopharyngeal(NP) swabs in vial transport medium  Result Value Ref Range Status   SARS Coronavirus 2 by RT PCR NEGATIVE NEGATIVE Final    Comment: (NOTE) SARS-CoV-2 target nucleic acids are NOT DETECTED.  The SARS-CoV-2 RNA is generally detectable in upper respiratory specimens during the acute phase of infection. The lowest concentration of SARS-CoV-2 viral copies this assay can detect is 138 copies/mL. A negative result does not preclude SARS-Cov-2 infection and should not be used as the sole basis for treatment or other patient management decisions. A negative result may occur with  improper specimen  collection/handling, submission of specimen other than nasopharyngeal swab, presence of viral mutation(s) within the areas targeted by this assay, and inadequate number of viral copies(<138 copies/mL). A negative result must be combined with clinical observations, patient history, and epidemiological information. The expected result is Negative.  Fact Sheet for Patients:  BloggerCourse.com  Fact Sheet for Healthcare Providers:  SeriousBroker.it  This test is no t yet approved or cleared by the [place] FDA and  has been authorized for detection and/or diagnosis of SARS-CoV-2 by FDA under an Emergency Use Authorization (EUA). This EUA will remain  in effect (meaning this test can be used) for the duration of the COVID-19 declaration under Section 564(b)(1) of the Act, 21 U.S.C.section 360bbb-3(b)(1), unless the authorization is terminated  or revoked sooner.  Influenza A by PCR NEGATIVE NEGATIVE Final   Influenza B by PCR NEGATIVE NEGATIVE Final    Comment: (NOTE) The Xpert Xpress SARS-CoV-2/FLU/RSV plus assay is intended as an aid in the diagnosis of influenza from Nasopharyngeal swab specimens and should not be used as a sole basis for treatment. Nasal washings and aspirates are unacceptable for Xpert Xpress SARS-CoV-2/FLU/RSV testing.  Fact Sheet for Patients: BloggerCourse.com  Fact Sheet for Healthcare Providers: SeriousBroker.it  This test is not yet approved or cleared by the [place] FDA and has been authorized for detection and/or diagnosis of SARS-CoV-2 by FDA under an Emergency Use Authorization (EUA). This EUA will remain in effect (meaning this test can be used) for the duration of the COVID-19 declaration under Section 564(b)(1) of the Act, 21 U.S.C. section 360bbb-3(b)(1), unless the authorization is terminated or revoked.  Performed at [facility], [address] [place], [place] [address]      Medications:    buPROPion  150 mg Oral q morning   carisoprodol  350 mg Oral TID   Chlorhexidine Gluconate Cloth  6 each Topical Daily   cloNIDine  0.1 mg Oral BID   DULoxetine  60 mg Oral Daily   enoxaparin (LOVENOX) injection  40 mg Subcutaneous Q24H   gabapentin  300 mg Oral QID   lisinopril  20 mg Oral Daily   rosuvastatin  20 mg Oral Daily   Continuous Infusions:  sodium chloride 75 mL/hr at 12/31/20 0400   niCARDipine Stopped (12/30/20 1844)      LOS: 1 day   Marinda Elk  [facility]  12/31/2020, 6:58 AM

## 2020-12-31 NOTE — Progress Notes (Signed)
Pts husband states he missed the doctors call this morning and would like for the doctor to call him in the morning when they round for an update on his wife.

## 2020-12-31 NOTE — TOC Initial Note (Signed)
Transition of Care [facility]) - Initial/Assessment Note    Patient Details  Name: Kathy Fischer MRN: [id number] Date of Birth: 1940/12/20  [facility]) CM/SW Contact:    Golda Acre, RN Phone Number: 12/31/2020, 8:21 AM  Clinical Narrative:                 80 y.o. female past medical history significant of Crohn's disease and hypertension resents with lower abdominal pain, at home she was unable to take her medications due to nausea and vomiting.  CT scan of the abdomen pelvis in the ED showed no acute findings systolic blood pressure was in the 230s she was started on IV medication     Assessment/Plan:    Hypertensive urgency: Currently on Cardene drip significantly improved. Now of cardene drip antihypertensive medication orally resume blood pressure is well controlled and wean off Cardene drip. TOC PLAN OF CARE: weaned from iv to po cardiene, transfer to med-surg floor. Expected Discharge Plan: Home/Self Care Barriers to Discharge: Continued Medical Work up   Patient Goals and CMS Choice Patient states their goals for this hospitalization and ongoing recovery are:: to go home CMS Medicare.gov Compare Post Acute Care list provided to:: Patient    Expected Discharge Plan and Services Expected Discharge Plan: Home/Self Care   Discharge Planning Services: CM Consult   Living arrangements for the past 2 months: Apartment                                      Prior Living Arrangements/Services Living arrangements for the past 2 months: Apartment Lives with:: Spouse Patient language and need for interpreter reviewed:: Yes Do you feel safe going back to the place where you live?: Yes            Criminal Activity/Legal Involvement Pertinent to Current Situation/Hospitalization: No - Comment as needed  Activities of Daily Living Home Assistive Devices/Equipment: Cane (specify quad or straight), Eyeglasses, Walker (specify type) (single point cane,  front wheeled walker) ADL Screening (condition at time of admission) Patient's cognitive ability adequate to safely complete daily activities?: Yes Is the patient deaf or have difficulty hearing?: No Does the patient have difficulty seeing, even when wearing glasses/contacts?: No Does the patient have difficulty concentrating, remembering, or making decisions?: Yes Patient able to express need for assistance with ADLs?: Yes Does the patient have difficulty dressing or bathing?: Yes Independently performs ADLs?: No Communication: Independent Dressing (OT): Needs assistance Is this a change from baseline?: Change from baseline, expected to last >3 days Grooming: Independent Feeding: Independent Bathing: Needs assistance Is this a change from baseline?: Change from baseline, expected to last >3 days Toileting: Needs assistance Is this a change from baseline?: Change from baseline, expected to last >3days In/Out Bed: Needs assistance Is this a change from baseline?: Change from baseline, expected to last >3 days Walks in Home: Needs assistance Is this a change from baseline?: Change from baseline, expected to last >3 days Does the patient have difficulty walking or climbing stairs?: Yes (secondary to weakness) Weakness of Legs: Both Weakness of Arms/Hands: None  Permission Sought/Granted                  Emotional Assessment Appearance:: Appears stated age     Orientation: : Oriented to Self, Oriented to Place, Oriented to  Time, Oriented to Situation Alcohol / Substance Use: Not Applicable Psych Involvement: No (comment)  Admission diagnosis:  Hyponatremia [E87.1] Hypertensive urgency [I16.0] Patient Active Problem List   Diagnosis Date Noted   Hypertensive urgency 12/30/2020   Acute cystitis 09/12/2020   Nausea & vomiting 09/12/2020   Generalized weakness 09/12/2020   Abdominal pain 09/12/2020   Hyponatremia 09/12/2020   Hypertension    Acquired hypothyroidism     PCP:  Renelda Loma., MD Pharmacy:   [facility] [id number] - [place], [place] - [address] [place] [address] Phone: [phone] Fax: [phone]     Social Determinants of Health (SDOH) Interventions    Readmission Risk Interventions No flowsheet data found.

## 2020-12-31 NOTE — Evaluation (Signed)
Physical Therapy Evaluation Patient Details Name: Kathy Fischer MRN: [id number] DOB: Apr 09, 1941 Today's Date: 12/31/2020   History of Present Illness  80 y.o. female past medical history significant of Crohn's disease and hypertension , cystitis,presents with lower abdominal pain,  nausea and vomiting.  CT scan of the abdomen pelvis in the ED showed no acute findings systolic blood pressure was in the 230s  Clinical Impression  The patient is very lethargic, oriented to date, not place. Patient  did arouse enough  to sit and stand at bedside with mod assistance at [facility]. No family present. Hopefully patient will progress to be able to DC home.   BP 115/66, HR 76. Pt admitted with above diagnosis.   Pt currently with functional limitations due to the deficits listed below (see PT Problem List). Pt will benefit from skilled PT to increase their independence and safety with mobility to allow discharge to the venue listed below.        Follow Up Recommendations Home health PT;Supervision/Assistance - 24 hour    Equipment Recommendations  None recommended by PT    Recommendations for Other Services   OT    Precautions / Restrictions Precautions Precautions: Fall      Mobility  Bed Mobility Overal bed mobility: Needs Assistance Bed Mobility: Supine to Sit;Sit to Supine     Supine to sit: Min assist Sit to supine: Min assist   General bed mobility comments: extra time and multimodal cues  to mobilize, patient lethargic    Transfers Overall transfer level: Needs assistance Equipment used: Rolling walker (2 wheeled) Transfers: Sit to/from Stand Sit to Stand: Mod assist         General transfer comment: Patient did stand at RW x 2 . Able to side step along the bed.  Ambulation/Gait                Stairs            Wheelchair Mobility    Modified Rankin (Stroke Patients Only)       Balance Overall balance assessment: Needs assistance Sitting-balance  support: Bilateral upper extremity supported;Feet supported Sitting balance-Leahy Scale: Fair     Standing balance support: Bilateral upper extremity supported;During functional activity Standing balance-Leahy Scale: Poor Standing balance comment: reliant on UE support                             Pertinent Vitals/Pain Pain Assessment: No/denies pain    Home Living Family/patient expects to be discharged to:: Private residence Living Arrangements: Spouse/significant other Available Help at Discharge: Family;Available 24 hours/day Type of Home: House Home Access: Ramped entrance     Home Layout: One level Home Equipment: Walker - 4 wheels;Cane - single point      Prior Function Level of Independence: Independent with assistive device(s)         Comments: uses SPC or rollator when going out, Independent with bathing/dressing, does not drive but husband does     Hand Dominance        Extremity/Trunk Assessment   Upper Extremity Assessment Upper Extremity Assessment: Overall WFL for tasks assessed    Lower Extremity Assessment Lower Extremity Assessment: Generalized weakness    Cervical / Trunk Assessment Cervical / Trunk Assessment: Normal  Communication      Cognition Arousal/Alertness: Lethargic Behavior During Therapy: WFL for tasks assessed/performed Overall Cognitive Status: No family/caregiver present to determine baseline cognitive functioning Area of Impairment: Orientation  Orientation Level: Place;Situation             General Comments: oriented to date, could not state why in hospital      General Comments      Exercises     Assessment/Plan    PT Assessment Patient needs continued PT services  PT Problem List Decreased strength;Decreased balance;Decreased cognition;Decreased mobility;Decreased knowledge of use of DME;Decreased activity tolerance       PT Treatment Interventions DME  instruction;Therapeutic activities;Gait training;Therapeutic exercise;Patient/family education;Functional mobility training    PT Goals (Current goals can be found in the Care Plan section)  Acute Rehab PT Goals Patient Stated Goal: none stated PT Goal Formulation: Patient unable to participate in goal setting Time For Goal Achievement: 01/14/21 Potential to Achieve Goals: Good    Frequency Min 3X/week   Barriers to discharge        Co-evaluation               AM-PAC PT "6 Clicks" Mobility  Outcome Measure Help needed turning from your back to your side while in a flat bed without using bedrails?: A Little Help needed moving from lying on your back to sitting on the side of a flat bed without using bedrails?: A Little Help needed moving to and from a bed to a chair (including a wheelchair)?: A Lot Help needed standing up from a chair using your arms (e.g., wheelchair or bedside chair)?: A Lot Help needed to walk in hospital room?: A Lot Help needed climbing 3-5 steps with a railing? : A Lot 6 Click Score: 14    End of Session   Activity Tolerance: Patient limited by fatigue Patient left: in bed;with call bell/phone within reach;with bed alarm set Nurse Communication: Mobility status PT Visit Diagnosis: Difficulty in walking, not elsewhere classified (R26.2)    Time: 2505-3976 PT Time Calculation (min) (ACUTE ONLY): 25 min   Charges:   PT Evaluation $PT Eval Low Complexity: 1 Low PT Treatments $Therapeutic Activity: 8-22 mins        Blanchard Kelch PT Acute Rehabilitation Services Pager [phone] Office [phone]   Rada Hay 12/31/2020, 4:31 PM

## 2021-01-01 MED ORDER — HYDRALAZINE HCL 10 MG PO TABS
10.0000 mg | ORAL_TABLET | Freq: Once | ORAL | Status: AC
  Administered 2021-01-01: 10 mg via ORAL
  Filled 2021-01-01: qty 1

## 2021-01-01 MED ORDER — LISINOPRIL 20 MG PO TABS: 20.0000 mg | ORAL_TABLET | Freq: Every day | ORAL | 3 refills | Status: DC

## 2021-01-01 MED ORDER — HYDROCHLOROTHIAZIDE 12.5 MG PO CAPS: 25.0000 mg | ORAL_CAPSULE | Freq: Every day | ORAL | 3 refills | Status: DC

## 2021-01-01 MED ORDER — HYDROCHLOROTHIAZIDE 12.5 MG PO CAPS
25.0000 mg | ORAL_CAPSULE | Freq: Every day | ORAL | Status: DC
  Administered 2021-01-01 – 2021-01-02 (×2): 25 mg via ORAL
  Filled 2021-01-01 (×2): qty 2

## 2021-01-01 MED ORDER — HYDRALAZINE HCL 25 MG PO TABS
25.0000 mg | ORAL_TABLET | Freq: Once | ORAL | Status: AC
  Administered 2021-01-01: 25 mg via ORAL
  Filled 2021-01-01: qty 1

## 2021-01-01 MED ORDER — ENALAPRILAT 1.25 MG/ML IV SOLN
0.6250 mg | Freq: Once | INTRAVENOUS | Status: DC
  Filled 2021-01-01: qty 0.5

## 2021-01-01 MED ORDER — HYDRALAZINE HCL 50 MG PO TABS: 50.0000 mg | ORAL_TABLET | Freq: Once | ORAL | Status: DC

## 2021-01-01 MED ORDER — CLONIDINE HCL 0.1 MG PO TABS: 0.1000 mg | ORAL_TABLET | Freq: Two times a day (BID) | ORAL | 11 refills | Status: DC

## 2021-01-01 NOTE — TOC Transition Note (Signed)
Transition of Care [facility]) - CM/SW Discharge Note   Patient Details  Name: Kathy Fischer MRN: [id number] Date of Birth: [birth year]  [facility]) CM/SW Contact:  Bartholome Bill, RN Phone Number: 01/01/2021, 10:14 AM   Clinical Narrative:    Spoke with pt for dc planning. Pt states she has her husband at home with her 24hrs a day. Physical therapy recommendations gone over with pt. She politely declines home health services at this time.      Barriers to Discharge: Continued Medical Work up   Patient Goals and CMS Choice Patient states their goals for this hospitalization and ongoing recovery are:: to go home CMS Medicare.gov Compare Post Acute Care list provided to:: Patient   Discharge Plan and Services   Discharge Planning Services: CM Consult                 Readmission Risk Interventions No flowsheet data found.

## 2021-01-01 NOTE — Discharge Summary (Signed)
Physician Discharge Summary  Kathy Fischer MRN:[id number] DOB: 08/18/1940 DOA: 12/29/2020  PCP: Bundy, Robert F Jr., MD  Admit date: 12/29/2020 Discharge date: 01/01/2021  Admitted From: Home Disposition:  Home  Recommendations for Outpatient Follow-up:  Follow up with PCP in 1-2 weeks Please obtain BMP/CBC in one week Check blood pressure and titrate antihypertensive medications as tolerated. Home Health:no Equipment/Devices:none  Discharge Condition:Stable CODE STATUS:Full Diet recommendation: Heart Healthy   Brief/Interim Summary: 80 y.o. female past medical history significant of Crohn's disease and hypertension resents with lower abdominal pain, at home she was unable to take her medications due to nausea and vomiting.  CT scan of the abdomen pelvis in the ED showed no acute findings systolic blood pressure was in the 230s she was started on IV medication  Discharge Diagnoses:  Active Problems:   Hypertensive urgency  Hypertensive urgency: Started on a Cardene drip her blood pressure improved to the 160 she was started back on her lisinopril and clonidine which she was not taking.  Next he was started on hydrochlorothiazide orally her blood pressure is around 160/90 she will follow-up with her primary care doctor with titrate antihypertensive medications as tolerated.  History of Crohn's disease: CT scan of the abdomen pelvis showed no acute findings electrolyte stable leukocytosis improved.  Mild non-anion gap metabolic acidosis: Resolved with IV fluid.  Mild hypovolemic hyponatremia resolved with IV fluid.  Discharge Instructions  Discharge Instructions     Diet - low sodium heart healthy   Complete by: As directed    Increase activity slowly   Complete by: As directed       Allergies as of 01/01/2021       Reactions   Dexamethasone Hypertension   Large doses   Penicillins Rash   Sulfa Antibiotics Other (See Comments)   Unknown        Medication List      STOP taking these medications    cefdinir 300 MG capsule Commonly known as: OMNICEF   hydrOXYzine 25 MG tablet Commonly known as: ATARAX/VISTARIL       TAKE these medications    buPROPion 150 MG 24 hr tablet Commonly known as: WELLBUTRIN XL Take 1 tablet by mouth every morning.   carisoprodol 350 MG tablet Commonly known as: SOMA Take 350 mg by mouth 3 (three) times daily.   Cholecalciferol 1.25 MG (50000 UT) capsule Take 1 capsule by mouth 2 (two) times a week.   cloNIDine 0.1 MG tablet Commonly known as: CATAPRES Take 1 tablet (0.1 mg total) by mouth 2 (two) times daily.   dicyclomine 20 MG tablet Commonly known as: BENTYL Take 1 tablet (20 mg total) by mouth every 6 (six) hours as needed. What changed: reasons to take this   DULoxetine 60 MG capsule Commonly known as: CYMBALTA Take 1 capsule by mouth daily.   gabapentin 300 MG capsule Commonly known as: NEURONTIN Take 1 capsule by mouth 4 (four) times daily.   hydrochlorothiazide 12.5 MG capsule Commonly known as: MICROZIDE Take 2 capsules (25 mg total) by mouth daily.   HYDROmorphone 2 MG tablet Commonly known as: DILAUDID Take 2 mg by mouth every 6 (six) hours as needed for pain.   levothyroxine 75 MCG tablet Commonly known as: SYNTHROID Take 75 mcg by mouth daily.   lisinopril 20 MG tablet Commonly known as: ZESTRIL Take 1 tablet (20 mg total) by mouth daily.   meclizine 25 MG tablet Commonly known as: ANTIVERT Take 25 mg by mouth 3 (three) times daily   as needed for dizziness.   naloxone 4 MG/0.1ML Liqd nasal spray kit Commonly known as: NARCAN Place 1 spray into the nose once as needed (overdose). one spray by Nasal route once as needed for up to 1 dose. Use per instruction in event of accidential over dose and call 911.   omeprazole 40 MG capsule Commonly known as: PRILOSEC Take 1 capsule by mouth daily.   prochlorperazine 5 MG tablet Commonly known as: COMPAZINE Take 1 tablet by  mouth every 6 (six) hours as needed for nausea or vomiting.   rosuvastatin 20 MG tablet Commonly known as: CRESTOR Take 20 mg by mouth daily.   zolpidem 10 MG tablet Commonly known as: AMBIEN Take 10 mg by mouth at bedtime as needed for sleep.        Allergies  Allergen Reactions   Dexamethasone Hypertension    Large doses   Penicillins Rash   Sulfa Antibiotics Other (See Comments)    Unknown    Consultations: None   Procedures/Studies: CT ABDOMEN PELVIS W CONTRAST  Result Date: 12/30/2020 CLINICAL DATA:  Abdominal pain.  History of Crohn's disease. EXAM: CT ABDOMEN AND PELVIS WITH CONTRAST TECHNIQUE: Multidetector CT imaging of the abdomen and pelvis was performed using the standard protocol following bolus administration of intravenous contrast. CONTRAST:  80mL OMNIPAQUE IOHEXOL 350 MG/ML SOLN COMPARISON:  None. FINDINGS: Lower chest: Coronary artery calcifications in the visualized left coronary artery. Aortic atherosclerosis. No acute abnormality. Small to moderate-sized hiatal hernia. Hepatobiliary: Cysts within the liver, the largest in the left hepatic lobe measuring 4 cm. Prior cholecystectomy. Pancreas: No focal abnormality or ductal dilatation. Spleen: No focal abnormality.  Normal size. Adrenals/Urinary Tract: No renal or adrenal mass. No hydronephrosis. Urinary bladder unremarkable. Stomach/Bowel: Stomach, large and small bowel grossly unremarkable. Vascular/Lymphatic: Calcified aorta and iliac vessels. No evidence of aneurysm or adenopathy. Reproductive: Prior hysterectomy.  No adnexal masses. Other: No free fluid or free air. Musculoskeletal: No acute bony abnormality. Scoliosis and degenerative changes in the lumbar spine. IMPRESSION: Small to moderate-sized hiatal hernia. Aortic atherosclerosis.  Coronary artery disease. No acute findings in the abdomen or pelvis. Electronically Signed   By: Kevin  Dover M.D.   On: 12/30/2020 01:44   CT ANGIO CHEST AORTA W/CM & OR  WO/CM  Result Date: 12/30/2020 CLINICAL DATA:  80-year-old female with chest and back pain. EXAM: CT ANGIOGRAPHY CHEST WITH CONTRAST TECHNIQUE: Multidetector CT imaging of the chest was performed using the standard protocol during bolus administration of intravenous contrast. Multiplanar CT image reconstructions and MIPs were obtained to evaluate the vascular anatomy. CONTRAST:  80mL OMNIPAQUE IOHEXOL 350 MG/ML SOLN COMPARISON:  CT Abdomen and Pelvis 0131 hours today. FINDINGS: Cardiovascular: Good contrast bolus timing in the pulmonary arterial tree. Respiratory motion in the lower lobes. This degrades detail of some subsegmental lower lobe branches. But there is no convincing filling defect identified in the pulmonary arteries to suggest acute pulmonary embolism. Moderate aortic atherosclerosis. Negative for thoracic aortic aneurysm or dissection. Tortuous proximal great vessels. Calcified coronary artery atherosclerosis. Mild cardiomegaly. No pericardial effusion. Mediastinum/Nodes: No mediastinal lymphadenopathy. Lungs/Pleura: Atelectatic changes to the major airways which remain patent. Diffuse crowding of lung markings and mild dependent atelectasis bilaterally. No consolidation. No pleural effusion. No convincing confluent or inflammatory appearing lung opacity. Atelectasis also associated with the moderate size gastric hiatal hernia described earlier today. Upper Abdomen: Stable to the CT Abdomen and Pelvis at 0131 hours today. Musculoskeletal: Degenerative changes in the thoracic spine. No acute osseous abnormality identified. Review   of the MIP images confirms the above findings. IMPRESSION: 1. Motion artifact in the lower lobes but no convincing acute pulmonary embolus. 2. Aortic Atherosclerosis (ICD10-I70.0). Negative for thoracic aortic dissection or aneurysm. 3. Atelectasis, but no other acute pulmonary finding. 4. Calcified coronary artery atherosclerosis and mild cardiomegaly. 5. Moderate size  gastric hiatal hernia as described by CT Abdomen and Pelvis earlier today. Electronically Signed   By: Genevie Ann M.D.   On: 12/30/2020 05:29   (Echo, Carotid, EGD, Colonoscopy, ERCP)    Subjective: No complaints  Discharge Exam: Vitals:   01/01/21 0427 01/01/21 0600  BP: (!) 195/89 (!) 191/84  Pulse: 71 72  Resp: 18   Temp: 97.6 F (36.4 C)   SpO2: 98%    Vitals:   12/31/20 1700 12/31/20 2044 01/01/21 0427 01/01/21 0600  BP: 139/77 (!) 154/77 (!) 195/89 (!) 191/84  Pulse: 75 77 71 72  Resp: _0 Temp: 98.4 F (36.9 C) 97.6 F (36.4 C) 97.6 F (36.4 C)   TempSrc: Oral Oral Oral   SpO2: 96% 96% 98%   Weight:      Height:        General: Pt is alert, awake, not in acute distress Cardiovascular: RRR, S1/S2 +, no rubs, no gallops Respiratory: CTA bilaterally, no wheezing, no rhonchi Abdominal: Soft, NT, ND, bowel sounds + Extremities: no edema, no cyanosis    The results of significant diagnostics from this hospitalization (including imaging, microbiology, ancillary and laboratory) are listed below for reference.     Microbiology: Recent Results (from the past 240 hour(s))  Urine Culture     Status: Abnormal   Collection Time: 12/29/20 11:16 PM   Specimen: Urine, Clean Catch  Result Value Ref Range Status   Specimen Description   Final    URINE, CLEAN CATCH Performed at [facility], [place] [address] [place], [place] [address]    Special Requests   Final    NONE Performed at [facility], [place] [address] [place], [place] [address]    Culture (A)  Final    <10,000 COLONIES/mL INSIGNIFICANT GROWTH Performed at [place] [address], [place], [place] [address]    Report Status 12/31/2020 FINAL  Final  Resp Panel by RT-PCR (Flu A&B, Covid) Nasopharyngeal Swab     Status: None   Collection Time: 12/30/20  7:57 AM   Specimen: Nasopharyngeal Swab; Nasopharyngeal(NP) swabs in vial transport medium  Result Value  Ref Range Status   SARS Coronavirus 2 by RT PCR NEGATIVE NEGATIVE Final    Comment: (NOTE) SARS-CoV-2 target nucleic acids are NOT DETECTED.  The SARS-CoV-2 RNA is generally detectable in upper respiratory specimens during the acute phase of infection. The lowest concentration of SARS-CoV-2 viral copies this assay can detect is 138 copies/mL. A negative result does not preclude SARS-Cov-2 infection and should not be used as the sole basis for treatment or other patient management decisions. A negative result may occur with  improper specimen collection/handling, submission of specimen other than nasopharyngeal swab, presence of viral mutation(s) within the areas targeted by this assay, and inadequate number of viral copies(<138 copies/mL). A negative result must be combined with clinical observations, patient history, and epidemiological information. The expected result is Negative.  Fact Sheet for Patients:  EntrepreneurPulse.com.au  Fact Sheet for Healthcare Providers:  IncredibleEmployment.be  This test is no t yet approved or cleared by the [place] FDA and  has been authorized for detection and/or  diagnosis of SARS-CoV-2 by FDA under an Emergency Use Authorization (EUA). This EUA will remain  in effect (meaning this test can be used) for the duration of the COVID-19 declaration under Section 564(b)(1) of the Act, 21 U.S.C.section 360bbb-3(b)(1), unless the authorization is terminated  or revoked sooner.       Influenza A by PCR NEGATIVE NEGATIVE Final   Influenza B by PCR NEGATIVE NEGATIVE Final    Comment: (NOTE) The Xpert Xpress SARS-CoV-2/FLU/RSV plus assay is intended as an aid in the diagnosis of influenza from Nasopharyngeal swab specimens and should not be used as a sole basis for treatment. Nasal washings and aspirates are unacceptable for Xpert Xpress SARS-CoV-2/FLU/RSV testing.  Fact Sheet for  Patients: https://www.fda.gov/media/152166/download  Fact Sheet for Healthcare Providers: https://www.fda.gov/media/152162/download  This test is not yet approved or cleared by the [place] FDA and has been authorized for detection and/or diagnosis of SARS-CoV-2 by FDA under an Emergency Use Authorization (EUA). This EUA will remain in effect (meaning this test can be used) for the duration of the COVID-19 declaration under Section 564(b)(1) of the Act, 21 U.S.C. section 360bbb-3(b)(1), unless the authorization is terminated or revoked.  Performed at [facility], [address] [place], [place] [address]      Labs: BNP (last 3 results) No results for input(s): BNP in the last 8760 hours. Basic Metabolic Panel: Recent Labs  Lab 12/29/20 2328 12/30/20 1151 12/30/20 1916 12/30/20 2302 12/31/20 0551  NA 127* 132* 131* 132* 132*  K 4.3 3.6 4.2 4.3 4.0  CL 97* 100 100 99 104  CO2 19* 17* 20* 20* 17*  GLUCOSE 107* 130* 130* 107* 83  BUN 20 18 22 23 24*  CREATININE 0.89 0.82  0.87 1.09* 1.04* 0.93  CALCIUM 9.3 9.6 8.9 9.1 9.1  PHOS  --  2.7 4.2 4.6 4.2   Liver Function Tests: Recent Labs  Lab 12/30/20 1151 12/30/20 1916 12/30/20 2302 12/31/20 0551  ALBUMIN 4.2 4.0 4.3 4.0   No results for input(s): LIPASE, AMYLASE in the last 168 hours. No results for input(s): AMMONIA in the last 168 hours. CBC: Recent Labs  Lab 12/29/20 2328 12/30/20 1151 12/31/20 0551  WBC 10.3 14.4* 11.2*  NEUTROABS 8.2*  --   --   HGB 12.6 13.3 12.9  HCT 37.8 38.7 40.6  MCV 92.6 90.6 96.9  PLT 312 345 290   Cardiac Enzymes: No results for input(s): CKTOTAL, CKMB, CKMBINDEX, TROPONINI in the last 168 hours. BNP: Invalid input(s): POCBNP CBG: No results for input(s): GLUCAP in the last 168 hours. D-Dimer No results for input(s): DDIMER in the last 72 hours. Hgb A1c No results for input(s): HGBA1C in the last 72 hours. Lipid Profile No results for  input(s): CHOL, HDL, LDLCALC, TRIG, CHOLHDL, LDLDIRECT in the last 72 hours. Thyroid function studies No results for input(s): TSH, T4TOTAL, T3FREE, THYROIDAB in the last 72 hours.  Invalid input(s): FREET3 Anemia work up No results for input(s): VITAMINB12, FOLATE, FERRITIN, TIBC, IRON, RETICCTPCT in the last 72 hours. Urinalysis    Component Value Date/Time   COLORURINE STRAW (A) 12/29/2020 2226   APPEARANCEUR CLEAR 12/29/2020 2226   LABSPEC 1.012 12/29/2020 2226   PHURINE 6.0 12/29/2020 2226   GLUCOSEU NEGATIVE 12/29/2020 2226   HGBUR NEGATIVE 12/29/2020 2226   BILIRUBINUR NEGATIVE 12/29/2020 2226   KETONESUR NEGATIVE 12/29/2020 2226   PROTEINUR 100 (A) 12/29/2020 2226   NITRITE NEGATIVE 12/29/2020 2226   LEUKOCYTESUR NEGATIVE 12/29/2020 2226   Sepsis Labs Invalid   input(s): PROCALCITONIN,  WBC,  LACTICIDVEN Microbiology Recent Results (from the past 240 hour(s))  Urine Culture     Status: Abnormal   Collection Time: 12/29/20 11:16 PM   Specimen: Urine, Clean Catch  Result Value Ref Range Status   Specimen Description   Final    URINE, CLEAN CATCH Performed at [facility], [place] [address] [place], [place] [address]    Special Requests   Final    NONE Performed at [facility], [place] [address], [place], [place] [address]    Culture (A)  Final    <10,000 COLONIES/mL INSIGNIFICANT GROWTH Performed at [place] [address] [place], [place] [address]    Report Status 12/31/2020 FINAL  Final  Resp Panel by RT-PCR (Flu A&B, Covid) Nasopharyngeal Swab     Status: None   Collection Time: 12/30/20  7:57 AM   Specimen: Nasopharyngeal Swab; Nasopharyngeal(NP) swabs in vial transport medium  Result Value Ref Range Status   SARS Coronavirus 2 by RT PCR NEGATIVE NEGATIVE Final    Comment: (NOTE) SARS-CoV-2 target nucleic acids are NOT DETECTED.  The SARS-CoV-2 RNA is generally detectable in upper respiratory specimens during  the acute phase of infection. The lowest concentration of SARS-CoV-2 viral copies this assay can detect is 138 copies/mL. A negative result does not preclude SARS-Cov-2 infection and should not be used as the sole basis for treatment or other patient management decisions. A negative result may occur with  improper specimen collection/handling, submission of specimen other than nasopharyngeal swab, presence of viral mutation(s) within the areas targeted by this assay, and inadequate number of viral copies(<138 copies/mL). A negative result must be combined with clinical observations, patient history, and epidemiological information. The expected result is Negative.  Fact Sheet for Patients:  EntrepreneurPulse.com.au  Fact Sheet for Healthcare Providers:  IncredibleEmployment.be  This test is no t yet approved or cleared by the [place] FDA and  has been authorized for detection and/or diagnosis of SARS-CoV-2 by FDA under an Emergency Use Authorization (EUA). This EUA will remain  in effect (meaning this test can be used) for the duration of the COVID-19 declaration under Section 564(b)(1) of the Act, 21 U.S.C.section 360bbb-3(b)(1), unless the authorization is terminated  or revoked sooner.       Influenza A by PCR NEGATIVE NEGATIVE Final   Influenza B by PCR NEGATIVE NEGATIVE Final    Comment: (NOTE) The Xpert Xpress SARS-CoV-2/FLU/RSV plus assay is intended as an aid in the diagnosis of influenza from Nasopharyngeal swab specimens and should not be used as a sole basis for treatment. Nasal washings and aspirates are unacceptable for Xpert Xpress SARS-CoV-2/FLU/RSV testing.  Fact Sheet for Patients: EntrepreneurPulse.com.au  Fact Sheet for Healthcare Providers: IncredibleEmployment.be  This test is not yet approved or cleared by the [place] FDA and has been authorized for detection and/or  diagnosis of SARS-CoV-2 by FDA under an Emergency Use Authorization (EUA). This EUA will remain in effect (meaning this test can be used) for the duration of the COVID-19 declaration under Section 564(b)(1) of the Act, 21 U.S.C. section 360bbb-3(b)(1), unless the authorization is terminated or revoked.  Performed at [facility], [place] [address], [place], [place] [address]      Time coordinating discharge: Over 30 minutes  SIGNED:   Charlynne Cousins, MD  [facility] 01/01/2021, 8:19 AM Pager   If 7PM-7AM, please contact night-coverage [email or web address] Password [email or web address]

## 2021-01-01 NOTE — Progress Notes (Signed)
Occupational Therapy Evaluation  Patient lives at home with spouse, reports mod I with self care tasks using rollator or cane for outdoor mobility. Currently patient presents with global weakness needing light min A to power up to standing and stand pivot with walker to recliner chair. Patient able to demonstrate LB dressing of pulling up socks at edge of bed without loss of balance. Encourage patient to utilize bedside commode with nursing vs purewick to maximize activity tolerance as she is wanting to get home to be with spouse "he has heart problems." Acute OT to follow.    01/01/21 0827  OT Visit Information  Last OT Received On 01/01/21  Assistance Needed +1  History of Present Illness 80 y.o. female past medical history significant of Crohn's disease and hypertension , cystitis,presents with lower abdominal pain,  nausea and vomiting.  CT scan of the abdomen pelvis in the ED showed no acute findings systolic blood pressure was in the 230s  Precautions  Precautions Fall  Precaution Comments monitor BP  Restrictions  Weight Bearing Restrictions No  Home Living  Family/patient expects to be discharged to: Private residence  Living Arrangements Spouse/significant other  Available Help at Discharge Family;Available 24 hours/day  Type of Home House  Home Access Ramped entrance  Home Layout One level  Bathroom Shower/Tub Tub/shower unit  Information systems manager (raised toilet seat)  Home Equipment Walker - 4 wheels;Cane - single point;Other (comment) (toilet riser with handles)  Prior Function  Level of Independence Independent with assistive device(s)  Comments uses SPC or rollator when going out, Independent with bathing/dressing, does not drive but husband does. does not use tub/shower spnge bathes instead  Communication  Communication No difficulties  Pain Assessment  Pain Assessment Faces  Faces Pain Scale 0  Cognition  Arousal/Alertness Awake/alert  Behavior During Therapy  WFL for tasks assessed/performed  Overall Cognitive Status Within Functional Limits for tasks assessed  Upper Extremity Assessment  Upper Extremity Assessment Generalized weakness  Lower Extremity Assessment  Lower Extremity Assessment Defer to PT evaluation  Cervical / Trunk Assessment  Cervical / Trunk Assessment Normal  ADL  Overall ADL's  Needs assistance/impaired  Eating/Feeding Independent;Sitting  Grooming Set up;Sitting  Upper Body Bathing Set up;Sitting  Lower Body Bathing Minimal assistance;Sit to/from stand  Upper Body Dressing  Set up;Sitting  Lower Body Dressing Set up;Minimal assistance;Sitting/lateral leans;Sit to/from stand  Lower Body Dressing Details (indicate cue type and reason) from seated position patient was able to pull up socks, min A in standing for steadying "I feel weak"  Toilet Transfer Minimal assistance;Stand-pivot;RW;Cueing for safety  Toilet Transfer Details (indicate cue type and reason) cues to push from bed vs pulling on walker. light min A for safety as patient reports feeling weak compared to baseline.  Toileting- Clothing Manipulation and Hygiene Minimal assistance;Sit to/from stand;Sitting/lateral lean  Functional mobility during ADLs Minimal assistance;Rolling walker;Cueing for safety  Bed Mobility  Overal bed mobility Needs Assistance  Bed Mobility Rolling;Sidelying to Sit  Rolling Supervision  Sidelying to sit Min guard;HOB elevated  General bed mobility comments patient reports history of chronic back pain. educated in log roll technique which patient was able to follow and min G for safety to sit upright. reports no pain with sitting up.  Transfers  Overall transfer level Needs assistance  Equipment used Rolling walker (2 wheeled)  Transfers Sit to/from [employer]  Sit to Stand Min assist  Stand pivot transfers Min assist  General transfer comment cues for hand placement. light min  A as patient reports feeling weak.   Balance  Overall balance assessment Needs assistance  Sitting-balance support Feet supported  Sitting balance-Leahy Scale Good  Standing balance support Bilateral upper extremity supported  Standing balance-Leahy Scale Poor  Standing balance comment reliant on UE support  General Comments  General comments (skin integrity, edema, etc.) O2 96-97% on RA, HR 77 and BP 166/86 post chair transfer. CNA aware  OT - End of Session  Equipment Utilized During Treatment Rolling walker  Activity Tolerance Patient tolerated treatment well  Patient left in chair;with call bell/phone within reach;with chair alarm set  Nurse Communication Mobility status  OT Assessment  OT Recommendation/Assessment Patient needs continued OT Services  OT Visit Diagnosis Muscle weakness (generalized) (M62.81)  OT Problem List Decreased activity tolerance;Decreased strength  OT Plan  OT Frequency (ACUTE ONLY) Min 2X/week  OT Treatment/Interventions (ACUTE ONLY) Self-care/ADL training;Therapeutic activities;Patient/family education;Balance training  AM-PAC OT "6 Clicks" Daily Activity Outcome Measure (Version 2)  Help from another person eating meals? 4  Help from another person taking care of personal grooming? 3  Help from another person toileting, which includes using toliet, bedpan, or urinal? 3  Help from another person bathing (including washing, rinsing, drying)? 3  Help from another person to put on and taking off regular upper body clothing? 3  Help from another person to put on and taking off regular lower body clothing? 3  6 Click Score 19  Progressive Mobility  What is the highest level of mobility based on the progressive mobility assessment? Level 3 (Stands with assist) - Balance while standing  and cannot march in place  Mobility Out of bed to chair with meals  OT Recommendation  Follow Up Recommendations Supervision/Assistance - 24 hour  OT Equipment None recommended by OT  Individuals Consulted   Consulted and Agree with Results and Recommendations Patient  Acute Rehab OT Goals  Patient Stated Goal "i'm weak"  OT Goal Formulation With patient  Time For Goal Achievement 01/15/21  Potential to Achieve Goals Good  OT Time Calculation  OT Start Time (ACUTE ONLY) 0725  OT Stop Time (ACUTE ONLY) 0748  OT Time Calculation (min) 23 min  OT General Charges  $OT Visit 1 Visit  OT Evaluation  $OT Eval Low Complexity 1 Low  OT Treatments  $Self Care/Home Management  8-22 mins  Written Expression  Dominant Hand Right   Marlyce Huge OT OT pager: [phone]

## 2021-01-02 MED ORDER — HYDRALAZINE HCL 20 MG/ML IJ SOLN: 5.0000 mg | Freq: Once | INTRAMUSCULAR | Status: DC

## 2021-01-02 MED ORDER — CLONIDINE HCL 0.2 MG PO TABS
0.2000 mg | ORAL_TABLET | Freq: Two times a day (BID) | ORAL | Status: DC
  Administered 2021-01-02: 0.2 mg via ORAL
  Filled 2021-01-02: qty 1

## 2021-01-02 MED ORDER — CLONIDINE HCL 0.2 MG PO TABS: 0.2000 mg | ORAL_TABLET | Freq: Two times a day (BID) | ORAL | 3 refills | Status: DC

## 2021-01-02 MED ORDER — HYDRALAZINE HCL 25 MG PO TABS
25.0000 mg | ORAL_TABLET | Freq: Once | ORAL | Status: AC
  Administered 2021-01-02: 25 mg via ORAL
  Filled 2021-01-02: qty 1

## 2021-01-02 NOTE — Progress Notes (Signed)
[facility] PROGRESS NOTE    Progress Note  Kathy Fischer  QIO:[id number] DOB: [birth year] DOA: 12/29/2020 PCP: Renelda Loma., MD     Brief Narrative:   Kathy Fischer is an 80 y.o. female past medical history significant of Crohn's disease and hypertension resents with lower abdominal pain, at home she was unable to take her medications due to nausea and vomiting.  CT scan of the abdomen pelvis in the ED showed no acute findings systolic blood pressure was in the 230s she was started on IV medication   Assessment/Plan:   Hypertensive urgency: Of IV medication now on oral her blood pressure is significantly improved we will try to continue to titrate medication as an outpatient. Patient is stable for discharge.  History of Crohn's disease abdominal pain: CT scan of the abdomen pelvis showed no acute findings UA showed no signs of infection.  Mild non-anion gap metabolic acidosis: Continue IV fluids in the setting of nausea and vomiting continue antiemetics.  Mild hyponatremia: Started on IV fluids slightly improved. Monitor strict I's and O's  Anxiety: Continue current home meds.  DVT prophylaxis: lovenox Family Communication:husband Status is: Inpatient  Remains inpatient appropriate because:Hemodynamically unstable  Dispo: The patient is from: Home              Anticipated d/c is to: Home              Patient currently is not medically stable to d/c.   Difficult to place patient No        Code Status:     Code Status Orders  (From admission, onward)           Start     Ordered   12/30/20 1100  Full code  Continuous        12/30/20 1059           Code Status History     Date Active Date Inactive Code Status Order ID Comments User Context   09/12/2020 0251 09/13/2020 1936 Full Code [id number]  Angie Fava, DO ED         IV Access:   Peripheral IV   Procedures and diagnostic studies:   No results found.   Medical  Consultants:   None.   Subjective:    [name] abdominal pain improved tolerating her diet  Objective:    Vitals:   01/01/21 1843 01/01/21 2113 01/01/21 2317 01/02/21 0458  BP: (!) 145/78 (!) 172/77 (!) 183/84 (!) 182/83  Pulse:  80  79  Resp:  (!) 21  15  Temp:  97.9 F (36.6 C)  98.4 F (36.9 C)  TempSrc:  Oral  Oral  SpO2:  97%  99%  Weight:      Height:       SpO2: 99 % O2 Flow Rate (L/min): 2 L/min   Intake/Output Summary (Last 24 hours) at 01/02/2021 0828 Last data filed at 01/01/2021 1900 Gross per 24 hour  Intake 360 ml  Output 750 ml  Net -390 ml    Filed Weights   12/30/20 1100  Weight: 71.9 kg    Exam: General exam: In no acute distress. Respiratory system: Good air movement and clear to auscultation. Cardiovascular system: S1 & S2 heard, RRR. No JVD. Gastrointestinal system: Abdomen is nondistended, soft and nontender.  Extremities: No pedal edema. Skin: No rashes, lesions or ulcers   Data Reviewed:    Labs: Basic Metabolic Panel: Recent Labs  Lab 12/29/20 2328 12/30/20 1151 12/30/20  1916 12/30/20 2302 12/31/20 0551  NA 127* 132* 131* 132* 132*  K 4.3 3.6 4.2 4.3 4.0  CL 97* 100 100 99 104  CO2 19* 17* 20* 20* 17*  GLUCOSE 107* 130* 130* 107* 83  BUN 20 18 22 23  24*  CREATININE 0.89 0.82  0.87 1.09* 1.04* 0.93  CALCIUM 9.3 9.6 8.9 9.1 9.1  PHOS  --  2.7 4.2 4.6 4.2    GFR Estimated Creatinine Clearance: 46.1 mL/min (by C-G formula based on SCr of 0.93 mg/dL). Liver Function Tests: Recent Labs  Lab 12/30/20 1151 12/30/20 1916 12/30/20 2302 12/31/20 0551  ALBUMIN 4.2 4.0 4.3 4.0    No results for input(s): LIPASE, AMYLASE in the last 168 hours. No results for input(s): AMMONIA in the last 168 hours. Coagulation profile No results for input(s): INR, PROTIME in the last 168 hours. COVID-19 Labs  No results for input(s): DDIMER, FERRITIN, LDH, CRP in the last 72 hours.  Lab Results  Component Value Date    SARSCOV2NAA NEGATIVE 12/30/2020   SARSCOV2NAA NEGATIVE 09/12/2020    CBC: Recent Labs  Lab 12/29/20 2328 12/30/20 1151 12/31/20 0551  WBC 10.3 14.4* 11.2*  NEUTROABS 8.2*  --   --   HGB 12.6 13.3 12.9  HCT 37.8 38.7 40.6  MCV 92.6 90.6 96.9  PLT 312 345 290    Cardiac Enzymes: No results for input(s): CKTOTAL, CKMB, CKMBINDEX, TROPONINI in the last 168 hours. BNP (last 3 results) No results for input(s): PROBNP in the last 8760 hours. CBG: No results for input(s): GLUCAP in the last 168 hours. D-Dimer: No results for input(s): DDIMER in the last 72 hours. Hgb A1c: No results for input(s): HGBA1C in the last 72 hours. Lipid Profile: No results for input(s): CHOL, HDL, LDLCALC, TRIG, CHOLHDL, LDLDIRECT in the last 72 hours. Thyroid function studies: No results for input(s): TSH, T4TOTAL, T3FREE, THYROIDAB in the last 72 hours.  Invalid input(s): FREET3 Anemia work up: No results for input(s): VITAMINB12, FOLATE, FERRITIN, TIBC, IRON, RETICCTPCT in the last 72 hours. Sepsis Labs: Recent Labs  Lab 12/29/20 2328 12/30/20 0923 12/30/20 1151 12/31/20 0551  WBC 10.3  --  14.4* 11.2*  LATICACIDVEN  --  1.2 1.4  --     Microbiology Recent Results (from the past 240 hour(s))  Urine Culture     Status: Abnormal   Collection Time: 12/29/20 11:16 PM   Specimen: Urine, Clean Catch  Result Value Ref Range Status   Specimen Description   Final    URINE, CLEAN CATCH Performed at [facility], [address] [place], [place]    Special Requests   Final    NONE Performed at [facility], [address], [place], [place]    Culture (A)  Final    <10,000 COLONIES/mL INSIGNIFICANT GROWTH Performed at [facility], [address], [place], [place]    Report Status 12/31/2020 FINAL  Final  Resp Panel by RT-PCR (Flu A&B, Covid) Nasopharyngeal Swab     Status: None   Collection Time: 12/30/20  7:57 AM    Specimen: Nasopharyngeal Swab; Nasopharyngeal(NP) swabs in vial transport medium  Result Value Ref Range Status   SARS Coronavirus 2 by RT PCR NEGATIVE NEGATIVE Final    Comment: (NOTE) SARS-CoV-2 target nucleic acids are NOT DETECTED.  The SARS-CoV-2 RNA is generally detectable in upper respiratory specimens during the acute phase of infection. The lowest concentration of SARS-CoV-2 viral copies this assay  can detect is 138 copies/mL. A negative result does not preclude SARS-Cov-2 infection and should not be used as the sole basis for treatment or other patient management decisions. A negative result may occur with  improper specimen collection/handling, submission of specimen other than nasopharyngeal swab, presence of viral mutation(s) within the areas targeted by this assay, and inadequate number of viral copies(<138 copies/mL). A negative result must be combined with clinical observations, patient history, and epidemiological information. The expected result is Negative.  Fact Sheet for Patients:  BloggerCourse.com  Fact Sheet for Healthcare Providers:  SeriousBroker.it  This test is no t yet approved or cleared by the [place] FDA and  has been authorized for detection and/or diagnosis of SARS-CoV-2 by FDA under an Emergency Use Authorization (EUA). This EUA will remain  in effect (meaning this test can be used) for the duration of the COVID-19 declaration under Section 564(b)(1) of the Act, 21 U.S.C.section 360bbb-3(b)(1), unless the authorization is terminated  or revoked sooner.       Influenza A by PCR NEGATIVE NEGATIVE Final   Influenza B by PCR NEGATIVE NEGATIVE Final    Comment: (NOTE) The Xpert Xpress SARS-CoV-2/FLU/RSV plus assay is intended as an aid in the diagnosis of influenza from Nasopharyngeal swab specimens and should not be used as a sole basis for treatment. Nasal washings and aspirates are  unacceptable for Xpert Xpress SARS-CoV-2/FLU/RSV testing.  Fact Sheet for Patients: BloggerCourse.com  Fact Sheet for Healthcare Providers: SeriousBroker.it  This test is not yet approved or cleared by the [place] FDA and has been authorized for detection and/or diagnosis of SARS-CoV-2 by FDA under an Emergency Use Authorization (EUA). This EUA will remain in effect (meaning this test can be used) for the duration of the COVID-19 declaration under Section 564(b)(1) of the Act, 21 U.S.C. section 360bbb-3(b)(1), unless the authorization is terminated or revoked.  Performed at [facility], [address], [place], [place] [address]      Medications:    buPROPion  150 mg Oral q morning   carisoprodol  350 mg Oral TID   Chlorhexidine Gluconate Cloth  6 each Topical Daily   cloNIDine  0.2 mg Oral BID   DULoxetine  60 mg Oral Daily   enoxaparin (LOVENOX) injection  40 mg Subcutaneous Q24H   gabapentin  300 mg Oral QID   hydrochlorothiazide  25 mg Oral Daily   levothyroxine  75 mcg Oral Q0600   lisinopril  20 mg Oral Daily   mouth rinse  15 mL Mouth Rinse BID   pantoprazole  40 mg Oral Daily   rosuvastatin  20 mg Oral Daily   Continuous Infusions:      LOS: 3 days   Marinda Elk  [facility]  01/02/2021, 8:28 AM

## 2021-05-03 ENCOUNTER — Other Ambulatory Visit: Payer: Self-pay

## 2021-05-03 ENCOUNTER — Emergency Department (HOSPITAL_BASED_OUTPATIENT_CLINIC_OR_DEPARTMENT_OTHER): Payer: Medicare Other

## 2021-05-03 ENCOUNTER — Emergency Department (HOSPITAL_BASED_OUTPATIENT_CLINIC_OR_DEPARTMENT_OTHER)
Admission: EM | Admit: 2021-05-03 | Discharge: 2021-05-04 | Disposition: A | Discharge status: Home / Self Care | Payer: Medicare Other | Attending: Emergency Medicine | Admitting: Emergency Medicine

## 2021-05-03 ENCOUNTER — Encounter (HOSPITAL_BASED_OUTPATIENT_CLINIC_OR_DEPARTMENT_OTHER): Payer: Self-pay | Admitting: Emergency Medicine

## 2021-05-03 DIAGNOSIS — I10 Essential (primary) hypertension: Secondary | ICD-10-CM | POA: Insufficient documentation

## 2021-05-03 DIAGNOSIS — E871 Hypo-osmolality and hyponatremia: Secondary | ICD-10-CM | POA: Insufficient documentation

## 2021-05-03 DIAGNOSIS — Z20822 Contact with and (suspected) exposure to covid-19: Secondary | ICD-10-CM | POA: Insufficient documentation

## 2021-05-03 DIAGNOSIS — Z79899 Other long term (current) drug therapy: Secondary | ICD-10-CM | POA: Insufficient documentation

## 2021-05-03 DIAGNOSIS — D72829 Elevated white blood cell count, unspecified: Secondary | ICD-10-CM | POA: Insufficient documentation

## 2021-05-03 DIAGNOSIS — R1084 Generalized abdominal pain: Secondary | ICD-10-CM

## 2021-05-03 DIAGNOSIS — R030 Elevated blood-pressure reading, without diagnosis of hypertension: Secondary | ICD-10-CM

## 2021-05-03 LAB — COMPREHENSIVE METABOLIC PANEL
ALT: 24 U/L (ref 0–44)
AST: 45 U/L — ABNORMAL HIGH (ref 15–41)
Albumin: 4.2 g/dL (ref 3.5–5.0)
Alkaline Phosphatase: 80 U/L (ref 38–126)
Anion gap: 14 (ref 5–15)
BUN: 25 mg/dL — ABNORMAL HIGH (ref 8–23)
CO2: 19 mmol/L — ABNORMAL LOW (ref 22–32)
Calcium: 9.8 mg/dL (ref 8.9–10.3)
Chloride: 94 mmol/L — ABNORMAL LOW (ref 98–111)
Creatinine, Ser: 1.15 mg/dL — ABNORMAL HIGH (ref 0.44–1.00)
GFR, Estimated: 48 mL/min — ABNORMAL LOW (ref >60)
Glucose, Bld: 111 mg/dL — ABNORMAL HIGH (ref 70–99)
Potassium: 3.6 mmol/L (ref 3.5–5.1)
Sodium: 127 mmol/L — ABNORMAL LOW (ref 135–145)
Total Bilirubin: 0.4 mg/dL (ref 0.3–1.2)
Total Protein: 7.7 g/dL (ref 6.5–8.1)

## 2021-05-03 LAB — CBC WITH DIFFERENTIAL/PLATELET
Abs Immature Granulocytes: 0.03 10*3/uL (ref 0.00–0.07)
Basophils Absolute: 0 10*3/uL (ref 0.0–0.1)
Basophils Relative: 0 %
Eosinophils Absolute: 0.1 10*3/uL (ref 0.0–0.5)
Eosinophils Relative: 1 %
HCT: 33.7 % — ABNORMAL LOW (ref 36.0–46.0)
Hemoglobin: 11.8 g/dL — ABNORMAL LOW (ref 12.0–15.0)
Immature Granulocytes: 0 %
Lymphocytes Relative: 11 %
Lymphs Abs: 1.3 10*3/uL (ref 0.7–4.0)
MCH: 30.6 pg (ref 26.0–34.0)
MCHC: 35 g/dL (ref 30.0–36.0)
MCV: 87.3 fL (ref 80.0–100.0)
Monocytes Absolute: 0.6 10*3/uL (ref 0.1–1.0)
Monocytes Relative: 5 %
Neutro Abs: 9.6 10*3/uL — ABNORMAL HIGH (ref 1.7–7.7)
Neutrophils Relative %: 83 %
Platelets: 267 10*3/uL (ref 150–400)
RBC: 3.86 MIL/uL — ABNORMAL LOW (ref 3.87–5.11)
RDW: 11.7 % (ref 11.5–15.5)
WBC: 11.5 10*3/uL — ABNORMAL HIGH (ref 4.0–10.5)
nRBC: 0 % (ref 0.0–0.2)

## 2021-05-03 LAB — TROPONIN I (HIGH SENSITIVITY): Troponin I (High Sensitivity): 8 ng/L (ref <18)

## 2021-05-03 LAB — RESP PANEL BY RT-PCR (FLU A&B, COVID) ARPGX2
Influenza A by PCR: NEGATIVE
Influenza B by PCR: NEGATIVE
SARS Coronavirus 2 by RT PCR: NEGATIVE

## 2021-05-03 LAB — LIPASE, BLOOD: Lipase: 31 U/L (ref 11–51)

## 2021-05-03 MED ORDER — ALUM & MAG HYDROXIDE-SIMETH 200-200-20 MG/5ML PO SUSP
30.0000 mL | Freq: Once | ORAL | Status: AC
  Administered 2021-05-03: 30 mL via ORAL
  Filled 2021-05-03: qty 30

## 2021-05-03 MED ORDER — SODIUM CHLORIDE 0.9 % IV BOLUS
1000.0000 mL | Freq: Once | INTRAVENOUS | Status: AC
  Administered 2021-05-03: 1000 mL via INTRAVENOUS

## 2021-05-03 MED ORDER — LIDOCAINE VISCOUS HCL 2 % MT SOLN
15.0000 mL | Freq: Once | OROMUCOSAL | Status: AC
  Administered 2021-05-03: 15 mL via ORAL
  Filled 2021-05-03: qty 15

## 2021-05-03 MED ORDER — ONDANSETRON HCL 4 MG/2ML IJ SOLN
4.0000 mg | Freq: Once | INTRAMUSCULAR | Status: AC
  Administered 2021-05-03: 4 mg via INTRAVENOUS
  Filled 2021-05-03: qty 2

## 2021-05-03 MED ORDER — HYDROMORPHONE HCL 1 MG/ML IJ SOLN
1.0000 mg | Freq: Once | INTRAMUSCULAR | Status: AC
  Administered 2021-05-03: 1 mg via INTRAVENOUS
  Filled 2021-05-03: qty 1

## 2021-05-03 MED ORDER — IOHEXOL 300 MG/ML  SOLN
100.0000 mL | Freq: Once | INTRAMUSCULAR | Status: AC | PRN
  Administered 2021-05-03: 100 mL via INTRAVENOUS

## 2021-05-03 MED ORDER — FAMOTIDINE IN NACL 20-0.9 MG/50ML-% IV SOLN
20.0000 mg | Freq: Once | INTRAVENOUS | Status: AC
  Administered 2021-05-03: 20 mg via INTRAVENOUS
  Filled 2021-05-03: qty 50

## 2021-05-03 NOTE — Discharge Instructions (Addendum)
Please follow up with PCP regarding low sodium level, repeat sodium testing in 3-4 days.   Make sure you are drinking electrolyte with fluids.  Take your blood pressure medications as prescribed.  Add Bentyl for ongoing pain and abdominal discomfort.

## 2021-05-03 NOTE — ED Triage Notes (Signed)
Pt presents to ED POV. Pt c/o epigastric pain, nausea, and dizziness that began wed night. Pt reports that husband has n/v/d.

## 2021-05-03 NOTE — ED Provider Notes (Signed)
[facility] EMERGENCY DEPT Provider Note   CSN: [id number] Arrival date & time: 05/03/21  2108     History Chief Complaint  Patient presents with   Abdominal Pain    Kathy Fischer is a 80 y.o. female.  This is a 80 y.o. female with significant medical history as below, including Crohn dz, htn who presents to the ED with complaint of abdominal pain, poor oral intake. Pt reports approximately 5 days of midepigastric, left upper quadrant abdominal pain described as a aching, pressure, tightness sensation.  Associate with nausea without emesis.  Patient with reduced oral intake over the past few days but she has been able to tolerate liquids.  She has been taking her home medications as prescribed as reports she ran out of her HCTZ approximately 1 week ago.  Patient denies change in bowel or bladder function, no fevers, chills, chest pain or dyspnea.  No rashes. No recent travel, no suspiocus oral intake.   Patient feels as though the pain today has been migrating down to her right lower quadrant.  Has been intermittent in nature.  No alleviating factors but discomfort is exacerbated by eating food.   Patient reports similar symptoms in the past associated with her elevated blood pressure.  The history is provided by the patient and a relative. No language interpreter was used.  Abdominal Pain Associated symptoms: nausea   Associated symptoms: no chest pain, no chills, no constipation, no cough, no diarrhea, no dysuria, no fever, no shortness of breath and no vomiting       Past Medical History:  Diagnosis Date   Crohn's disease ([place])    Hypertension     Patient Active Problem List   Diagnosis Date Noted   Hypertensive urgency 12/30/2020   Acute cystitis 09/12/2020   Nausea & vomiting 09/12/2020   Generalized weakness 09/12/2020   Abdominal pain 09/12/2020   Hyponatremia 09/12/2020   Hypertension    Acquired hypothyroidism     History reviewed. No pertinent  surgical history.   OB History   No obstetric history on file.     History reviewed. No pertinent family history.  Social History   Tobacco Use   Smoking status: Never   Smokeless tobacco: Never  Substance Use Topics   Alcohol use: Not Currently   Drug use: Never    Home Medications Prior to Admission medications   Medication Sig Start Date End Date Taking? Authorizing Provider  buPROPion (WELLBUTRIN XL) 150 MG 24 hr tablet Take 1 tablet by mouth every morning. 05/02/20   [provider]  carisoprodol (SOMA) 350 MG tablet Take 350 mg by mouth 3 (three) times daily. 11/01/17   [provider]  Cholecalciferol 1.25 MG (50000 UT) capsule Take 1 capsule by mouth 2 (two) times a week. 03/22/18   [provider]  cloNIDine (CATAPRES) 0.2 MG tablet Take 1 tablet (0.2 mg total) by mouth 2 (two) times daily. 01/02/21   Charlynne Cousins, MD  dicyclomine (BENTYL) 20 MG tablet Take 1 tablet (20 mg total) by mouth every 6 (six) hours as needed. 09/13/20   Georgette Shell, MD  DULoxetine (CYMBALTA) 60 MG capsule Take 1 capsule by mouth daily. 08/04/20   [provider]  gabapentin (NEURONTIN) 300 MG capsule Take 1 capsule by mouth 4 (four) times daily. 05/21/20   [provider]  hydrochlorothiazide (MICROZIDE) 12.5 MG capsule Take 2 capsules (25 mg total) by mouth daily. 01/01/21   Charlynne Cousins, MD  levothyroxine (SYNTHROID)  75 MCG tablet Take 75 mcg by mouth daily. 01/15/20   [provider]  lisinopril (ZESTRIL) 20 MG tablet Take 1 tablet (20 mg total) by mouth daily. 01/01/21   Charlynne Cousins, MD  meclizine (ANTIVERT) 25 MG tablet Take 25 mg by mouth 3 (three) times daily as needed for dizziness. 10/09/20 10/09/21  [provider]  naloxone [facility]) nasal spray 4 mg/0.1 mL Place 1 spray into the nose once as needed (overdose). one spray by Nasal route once as needed for up to 1 dose. Use per instruction in event of  accidential over dose and call 911. 08/13/19   [provider]  omeprazole (PRILOSEC) 40 MG capsule Take 1 capsule by mouth daily. 05/12/20   [provider]  prochlorperazine (COMPAZINE) 5 MG tablet Take 1 tablet by mouth every 6 (six) hours as needed for nausea or vomiting. 03/31/20   [provider]  rosuvastatin (CRESTOR) 20 MG tablet Take 20 mg by mouth daily. 08/04/20   [provider]  zolpidem (AMBIEN) 10 MG tablet Take 10 mg by mouth at bedtime as needed for sleep. 05/20/19   [provider]    Allergies    Dexamethasone, Penicillins, and Sulfa antibiotics  Review of Systems   Review of Systems  Constitutional:  Positive for appetite change. Negative for activity change, chills and fever.  HENT:  Negative for facial swelling and trouble swallowing.   Eyes:  Negative for discharge and redness.  Respiratory:  Negative for cough and shortness of breath.   Cardiovascular:  Negative for chest pain and palpitations.  Gastrointestinal:  Positive for abdominal pain and nausea. Negative for constipation, diarrhea and vomiting.  Genitourinary:  Negative for dysuria, flank pain and urgency.  Musculoskeletal:  Negative for back pain and gait problem.  Skin:  Negative for pallor and rash.  Neurological:  Negative for syncope and headaches.   Physical Exam Updated Vital Signs BP (!) 217/101   Pulse 68   Temp 98 F (36.7 C)   Resp (!) 21   Ht 5\' 3"  (1.6 m)   Wt 69.4 kg   SpO2 100%   BMI 27.10 kg/m   Physical Exam Vitals and nursing note reviewed.  Constitutional:      General: She is not in acute distress.    Appearance: Normal appearance.  HENT:     Head: Normocephalic and atraumatic.     Right Ear: External ear normal.     Left Ear: External ear normal.     Nose: Nose normal.     Mouth/Throat:     Mouth: Mucous membranes are moist.  Eyes:     General: No scleral icterus.       Right eye: No discharge.        Left eye: No  discharge.     Extraocular Movements: Extraocular movements intact.     Pupils: Pupils are equal, round, and reactive to light.  Cardiovascular:     Rate and Rhythm: Normal rate and regular rhythm.     Pulses: Normal pulses.     Heart sounds: Normal heart sounds.  Pulmonary:     Effort: Pulmonary effort is normal. No respiratory distress.     Breath sounds: Normal breath sounds.  Abdominal:     General: Abdomen is flat. Bowel sounds are normal.     Palpations: Abdomen is soft.     Tenderness: There is no abdominal tenderness. There is no right CVA tenderness, left CVA tenderness, guarding or rebound.  Musculoskeletal:        General: Normal range of motion.     Cervical back: Normal range of motion.     Right lower leg: No edema.     Left lower leg: No edema.  Skin:    General: Skin is warm and dry.     Capillary Refill: Capillary refill takes less than 2 seconds.  Neurological:     Mental Status: She is alert and oriented to person, place, and time. Mental status is at baseline.     GCS: GCS eye subscore is 4. GCS verbal subscore is 5. GCS motor subscore is 6.     Cranial Nerves: Cranial nerves 2-12 are intact.     Sensory: Sensation is intact.     Motor: Motor function is intact.     Coordination: Coordination is intact.     Gait: Gait is intact.  Psychiatric:        Mood and Affect: Mood normal.        Behavior: Behavior normal.    ED Results / Procedures / Treatments   Labs (all labs ordered are listed, but only abnormal results are displayed) Labs Reviewed  COMPREHENSIVE METABOLIC PANEL - Abnormal; Notable for the following components:      Result Value   Sodium 127 (*)    Chloride 94 (*)    CO2 19 (*)    Glucose, Bld 111 (*)    BUN 25 (*)    Creatinine, Ser 1.15 (*)    AST 45 (*)    GFR, Estimated 48 (*)    All other components within normal limits  CBC WITH DIFFERENTIAL/PLATELET - Abnormal; Notable for the following components:   WBC 11.5 (*)    RBC 3.86 (*)     Hemoglobin 11.8 (*)    HCT 33.7 (*)    Neutro Abs 9.6 (*)    All other components within normal limits  RESP PANEL BY RT-PCR (FLU A&B, COVID) ARPGX2  LIPASE, BLOOD  URINALYSIS, ROUTINE W REFLEX MICROSCOPIC  TROPONIN I (HIGH SENSITIVITY)  TROPONIN I (HIGH SENSITIVITY)    EKG EKG Interpretation  Date/Time:  Sunday May 03 2021 21:28:35 EST Ventricular Rate:  76 PR Interval:  190 QRS Duration: 116 QT Interval:  416 QTC Calculation: 468 R Axis:   -74 Text Interpretation: Normal sinus rhythm Left axis deviation Low voltage QRS Right bundle branch block Inferior infarct , age undetermined Possible Anterolateral infarct , age undetermined Abnormal ECG No significant change since last tracing Confirmed by Thayer Jew [phone]) on 05/03/2021 11:02:11 PM  Radiology CT ABDOMEN PELVIS W CONTRAST  Result Date: 05/03/2021 CLINICAL DATA:  Abdominal pain, acute, nonlocalized EXAM: CT ABDOMEN AND PELVIS WITH CONTRAST TECHNIQUE: Multidetector CT imaging of the abdomen and pelvis was performed using the standard protocol following bolus administration of intravenous contrast. CONTRAST:  171mL OMNIPAQUE IOHEXOL 300 MG/ML  SOLN COMPARISON:  12/30/2020 FINDINGS: Lower chest: No acute abnormality.  Moderate hiatal hernia. Hepatobiliary: Cysts scattered throughout the liver, the largest centrally measuring 4.6 cm. Prior cholecystectomy. No biliary ductal dilatation. Pancreas: No focal abnormality or ductal dilatation. Spleen: No focal abnormality.  Normal size. Adrenals/Urinary Tract: No adrenal abnormality. No focal renal abnormality. No stones or hydronephrosis. Urinary bladder is unremarkable. Stomach/Bowel: Stomach, large and small bowel grossly unremarkable. Vascular/Lymphatic: Heavily calcified aorta and iliac vessels. No evidence of aneurysm or adenopathy. Reproductive: Prior hysterectomy.  No adnexal masses. Other: No free fluid or free air. Musculoskeletal: No acute bony abnormality. Rightward  scoliosis and degenerative  changes in the lumbar spine. IMPRESSION: No acute findings in the abdomen or pelvis. Aortic atherosclerosis. Electronically Signed   By: Charlett Nose M.D.   On: 05/03/2021 23:31   DG Chest Port 1 View  Result Date: 05/03/2021 CLINICAL DATA:  Epigastric pain, nausea, dizziness EXAM: PORTABLE CHEST 1 VIEW COMPARISON:  09/12/2020 FINDINGS: Lungs are clear.  No pleural effusion or pneumothorax. The heart is normal in size. Stable eventration of the right hemidiaphragm. IMPRESSION: No evidence of acute cardiopulmonary disease. Electronically Signed   By: Charline Bills M.D.   On: 05/03/2021 22:30    Procedures Procedures   Medications Ordered in ED Medications  famotidine (PEPCID) IVPB 20 mg premix (20 mg Intravenous New Bag/Given 05/03/21 2223)  alum & mag hydroxide-simeth (MAALOX/MYLANTA) 200-200-20 MG/5ML suspension 30 mL (30 mLs Oral Given 05/03/21 2223)    And  lidocaine (XYLOCAINE) 2 % viscous mouth solution 15 mL (15 mLs Oral Given 05/03/21 2223)  sodium chloride 0.9 % bolus 1,000 mL (1,000 mLs Intravenous New Bag/Given 05/03/21 2227)  HYDROmorphone (DILAUDID) injection 1 mg (1 mg Intravenous Given 05/03/21 2239)  ondansetron (ZOFRAN) injection 4 mg (4 mg Intravenous Given 05/03/21 2239)  iohexol (OMNIPAQUE) 300 MG/ML solution 100 mL (100 mLs Intravenous Contrast Given 05/03/21 2313)    ED Course  I have reviewed the triage vital signs and the nursing notes.  Pertinent labs & imaging results that were available during my care of the patient were reviewed by me and considered in my medical decision making (see chart for details).    MDM Rules/Calculators/A&P                           CC: abd pain  This patient complains of above; this involves an extensive number of treatment options and is a complaint that carries with it a high risk of complications and morbidity. Vital signs were reviewed. Serious etiologies considered.  Record review:  Previous  records obtained and reviewed   Additional history obtained from daughter in law  Work up as above, notable for:  Labs & imaging results that were available during my care of the patient were reviewed by me and considered in my medical decision making.   I ordered imaging studies which included CXR, CT abd/pelvis and I independently visualized and interpreted imaging which showed CXR was negative, CT is pending at time of handoff.  Labs reviewed, she has mild leukocytosis 11.5.  Suspicion for sepsis is low.  Patient has hyponatremia worsened from her baseline.  She does report reduced oral intake over the past 5 days.  She is on thiazide diuretic as well.  Management: Given IVF, GI cocktail, analgesics.  Reports symptoms improved following intervention.    Pt with epigastric and RLQ abdominal pain x5 days, she is pending CT imaging of her abdomen/pelvis. If this imaging is negative it would be reasonable to offer supportive care and discharge patient home with strict return precautions. Would favor not continuing HCTZ given hyponatremia and would recommend she follow up with PCP in the next few days for repeat BMP and tailoring of medications at that time.         This chart was dictated using voice recognition software.  Despite best efforts to proofread,  errors can occur which can change the documentation meaning.  Final Clinical Impression(s) / ED Diagnoses Final diagnoses:  Generalized abdominal pain  Hyponatremia  Elevated blood pressure reading    Rx / DC Orders ED  Discharge Orders     None        Jeanell Sparrow, DO 05/03/21 2333

## 2021-05-04 MED ORDER — ONDANSETRON 4 MG PO TBDP
4.0000 mg | ORAL_TABLET | Freq: Three times a day (TID) | ORAL | 0 refills | Status: DC | PRN
Start: 2021-05-04 — End: 2021-05-13

## 2021-05-04 MED ORDER — HYDROMORPHONE HCL 1 MG/ML IJ SOLN
1.0000 mg | Freq: Once | INTRAMUSCULAR | Status: AC
  Administered 2021-05-04: 1 mg via INTRAVENOUS
  Filled 2021-05-04: qty 1

## 2021-05-04 MED ORDER — DICYCLOMINE HCL 20 MG PO TABS: 20.0000 mg | ORAL_TABLET | Freq: Two times a day (BID) | ORAL | 0 refills | Status: DC | PRN

## 2021-05-04 MED ORDER — HYDRALAZINE HCL 20 MG/ML IJ SOLN
10.0000 mg | Freq: Once | INTRAMUSCULAR | Status: AC
  Administered 2021-05-04: 10 mg via INTRAVENOUS
  Filled 2021-05-04: qty 1

## 2021-05-04 MED ORDER — HYDROCHLOROTHIAZIDE 12.5 MG PO CAPS: 25.0000 mg | ORAL_CAPSULE | Freq: Every day | ORAL | 3 refills | Status: DC

## 2021-05-06 ENCOUNTER — Emergency Department (HOSPITAL_BASED_OUTPATIENT_CLINIC_OR_DEPARTMENT_OTHER): Payer: Medicare Other

## 2021-05-06 ENCOUNTER — Encounter (HOSPITAL_BASED_OUTPATIENT_CLINIC_OR_DEPARTMENT_OTHER): Payer: Self-pay

## 2021-05-06 ENCOUNTER — Emergency Department (HOSPITAL_BASED_OUTPATIENT_CLINIC_OR_DEPARTMENT_OTHER)
Admission: EM | Admit: 2021-05-06 | Discharge: 2021-05-06 | Disposition: A | Discharge status: Home / Self Care | Payer: Medicare Other | Attending: Emergency Medicine | Admitting: Emergency Medicine

## 2021-05-06 ENCOUNTER — Other Ambulatory Visit: Payer: Self-pay

## 2021-05-06 DIAGNOSIS — E871 Hypo-osmolality and hyponatremia: Secondary | ICD-10-CM | POA: Insufficient documentation

## 2021-05-06 DIAGNOSIS — R1084 Generalized abdominal pain: Secondary | ICD-10-CM | POA: Diagnosis not present

## 2021-05-06 DIAGNOSIS — Z79899 Other long term (current) drug therapy: Secondary | ICD-10-CM | POA: Insufficient documentation

## 2021-05-06 DIAGNOSIS — I129 Hypertensive chronic kidney disease with stage 1 through stage 4 chronic kidney disease, or unspecified chronic kidney disease: Secondary | ICD-10-CM | POA: Insufficient documentation

## 2021-05-06 DIAGNOSIS — N189 Chronic kidney disease, unspecified: Secondary | ICD-10-CM | POA: Insufficient documentation

## 2021-05-06 DIAGNOSIS — R7402 Elevation of levels of lactic acid dehydrogenase (LDH): Secondary | ICD-10-CM | POA: Insufficient documentation

## 2021-05-06 DIAGNOSIS — E039 Hypothyroidism, unspecified: Secondary | ICD-10-CM | POA: Diagnosis not present

## 2021-05-06 DIAGNOSIS — D649 Anemia, unspecified: Secondary | ICD-10-CM

## 2021-05-06 DIAGNOSIS — R109 Unspecified abdominal pain: Secondary | ICD-10-CM | POA: Diagnosis present

## 2021-05-06 LAB — COMPREHENSIVE METABOLIC PANEL
ALT: 28 U/L (ref 0–44)
AST: 46 U/L — ABNORMAL HIGH (ref 15–41)
Albumin: 4.4 g/dL (ref 3.5–5.0)
Alkaline Phosphatase: 78 U/L (ref 38–126)
Anion gap: 13 (ref 5–15)
BUN: 21 mg/dL (ref 8–23)
CO2: 19 mmol/L — ABNORMAL LOW (ref 22–32)
Calcium: 9.9 mg/dL (ref 8.9–10.3)
Chloride: 98 mmol/L (ref 98–111)
Creatinine, Ser: 1.45 mg/dL — ABNORMAL HIGH (ref 0.44–1.00)
GFR, Estimated: 36 mL/min — ABNORMAL LOW (ref >60)
Glucose, Bld: 103 mg/dL — ABNORMAL HIGH (ref 70–99)
Potassium: 3.7 mmol/L (ref 3.5–5.1)
Sodium: 130 mmol/L — ABNORMAL LOW (ref 135–145)
Total Bilirubin: 0.3 mg/dL (ref 0.3–1.2)
Total Protein: 7.7 g/dL (ref 6.5–8.1)

## 2021-05-06 LAB — CBC WITH DIFFERENTIAL/PLATELET
Abs Immature Granulocytes: 0.03 10*3/uL (ref 0.00–0.07)
Basophils Absolute: 0.1 10*3/uL (ref 0.0–0.1)
Basophils Relative: 1 %
Eosinophils Absolute: 0.1 10*3/uL (ref 0.0–0.5)
Eosinophils Relative: 1 %
HCT: 32.5 % — ABNORMAL LOW (ref 36.0–46.0)
Hemoglobin: 11.3 g/dL — ABNORMAL LOW (ref 12.0–15.0)
Immature Granulocytes: 0 %
Lymphocytes Relative: 24 %
Lymphs Abs: 2.4 10*3/uL (ref 0.7–4.0)
MCH: 30.5 pg (ref 26.0–34.0)
MCHC: 34.8 g/dL (ref 30.0–36.0)
MCV: 87.8 fL (ref 80.0–100.0)
Monocytes Absolute: 0.7 10*3/uL (ref 0.1–1.0)
Monocytes Relative: 7 %
Neutro Abs: 6.7 10*3/uL (ref 1.7–7.7)
Neutrophils Relative %: 67 %
Platelets: UNDETERMINED 10*3/uL (ref 150–400)
RBC: 3.7 MIL/uL — ABNORMAL LOW (ref 3.87–5.11)
RDW: 11.9 % (ref 11.5–15.5)
WBC: 10 10*3/uL (ref 4.0–10.5)
nRBC: 0 % (ref 0.0–0.2)

## 2021-05-06 LAB — LIPASE, BLOOD: Lipase: 33 U/L (ref 11–51)

## 2021-05-06 LAB — LACTIC ACID, PLASMA: Lactic Acid, Venous: 2.5 mmol/L (ref 0.5–1.9)

## 2021-05-06 MED ORDER — LISINOPRIL 10 MG PO TABS: 20.0000 mg | ORAL_TABLET | Freq: Once | ORAL | Status: DC

## 2021-05-06 MED ORDER — HYDROMORPHONE HCL 1 MG/ML IJ SOLN
2.0000 mg | Freq: Once | INTRAMUSCULAR | Status: DC
  Filled 2021-05-06: qty 2

## 2021-05-06 MED ORDER — HYDROMORPHONE HCL 2 MG PO TABS: 2.0000 mg | ORAL_TABLET | Freq: Once | ORAL | Status: DC

## 2021-05-06 MED ORDER — METOCLOPRAMIDE HCL 5 MG PO TABS: 5.0000 mg | ORAL_TABLET | Freq: Four times a day (QID) | ORAL | 0 refills | Status: AC | PRN

## 2021-05-06 MED ORDER — METOCLOPRAMIDE HCL 5 MG/ML IJ SOLN
5.0000 mg | Freq: Once | INTRAMUSCULAR | Status: AC
  Administered 2021-05-06: 12:00:00 5 mg via INTRAVENOUS
  Filled 2021-05-06: qty 2

## 2021-05-06 MED ORDER — HYDROMORPHONE HCL 1 MG/ML IJ SOLN: 1.0000 mg | Freq: Once | INTRAMUSCULAR | Status: DC

## 2021-05-06 MED ORDER — PREDNISONE 10 MG (21) PO TBPK: ORAL_TABLET | Freq: Every day | ORAL | 0 refills | Status: AC

## 2021-05-06 MED ORDER — HYDROMORPHONE HCL 1 MG/ML IJ SOLN
2.0000 mg | Freq: Once | INTRAMUSCULAR | Status: AC
  Administered 2021-05-06: 09:00:00 2 mg via INTRAVENOUS

## 2021-05-06 MED ORDER — SODIUM CHLORIDE 0.9 % IV BOLUS
500.0000 mL | Freq: Once | INTRAVENOUS | Status: AC
  Administered 2021-05-06: 09:00:00 500 mL via INTRAVENOUS

## 2021-05-06 MED ORDER — HYDROMORPHONE HCL 1 MG/ML IJ SOLN
1.0000 mg | Freq: Once | INTRAMUSCULAR | Status: AC
  Administered 2021-05-06: 06:00:00 1 mg via INTRAVENOUS

## 2021-05-06 MED ORDER — CLONIDINE HCL 0.1 MG PO TABS: 0.2000 mg | ORAL_TABLET | Freq: Once | ORAL | Status: DC

## 2021-05-06 MED ORDER — DICYCLOMINE HCL 10 MG PO CAPS
10.0000 mg | ORAL_CAPSULE | Freq: Once | ORAL | Status: AC
  Administered 2021-05-06: 09:00:00 10 mg via ORAL
  Filled 2021-05-06: qty 1

## 2021-05-06 MED ORDER — ONDANSETRON HCL 4 MG/2ML IJ SOLN: 4.0000 mg | Freq: Once | INTRAMUSCULAR | Status: DC

## 2021-05-06 MED ORDER — DIPHENHYDRAMINE HCL 50 MG/ML IJ SOLN
25.0000 mg | Freq: Once | INTRAMUSCULAR | Status: AC
  Administered 2021-05-06: 12:00:00 25 mg via INTRAVENOUS
  Filled 2021-05-06: qty 1

## 2021-05-06 MED ORDER — DIPHENHYDRAMINE HCL 25 MG PO TABS: 25.0000 mg | ORAL_TABLET | Freq: Four times a day (QID) | ORAL | 0 refills | Status: AC | PRN

## 2021-05-06 MED ORDER — ONDANSETRON 4 MG PO TBDP
8.0000 mg | ORAL_TABLET | Freq: Once | ORAL | Status: AC
  Administered 2021-05-06: 06:00:00 8 mg via ORAL
  Filled 2021-05-06: qty 2

## 2021-05-06 MED ORDER — IOHEXOL 350 MG/ML SOLN
100.0000 mL | Freq: Once | INTRAVENOUS | Status: AC | PRN
  Administered 2021-05-06: 08:00:00 100 mL via INTRAVENOUS

## 2021-05-06 NOTE — ED Provider Notes (Signed)
°  Provider Note MRN:  [id number]  Arrival date & time: 05/06/21    ED Course and Medical Decision Making  Assumed care from [place] at shift change.  See not from prior team for complete details, in brief: pt with ongoing abdominal pain, worse after eating. Negative CT at previous visit. CT angio today to evaluate for mesenteric ischemia. Poorly controlled HTN.  Plan per prior physician if CT negative and pain well controlled likely dc.   CT imaging has resulted, no acute process.  Labs reviewed and are stable.  Patient reports that she is feeling better after IV Dilaudid.  Pain control is difficult as patient given that she is on large doses of oral Dilaudid at home.  Nausea has resolved.  Patient is also received IV fluid rehydration.  Given repeat ER evaluation secondary to ongoing abdominal pain hospitalization was offered to the patient however she defers to go home and follow-up with her PCP. This was discussed with her spouse and son at the bedside.   Will adjust antiemetic regimen and home, give her a short dose of prednisone taper for possible exacerbation of underlying Crohn's disease.  Advise she follow-up with pain management physician and gastroenterology and PCP regarding elevated blood pressure level. Advised clear liquid diet.   Discussed strict return precautions to which she verbalized understanding and agreement.  The patient improved significantly and was discharged in stable condition. Detailed discussions were had with the patient regarding current findings, and need for close f/u with PCP or on call doctor. The patient has been instructed to return immediately if the symptoms worsen in any way for re-evaluation. Patient verbalized understanding and is in agreement with current care plan. All questions answered prior to discharge.      Procedures  Final Clinical Impressions(s) / ED Diagnoses     ICD-10-CM   1. Generalized abdominal pain  R10.84     2. Normochromic  normocytic anemia  D64.9       ED Discharge Orders          Ordered    predniSONE (STERAPRED UNI-PAK 21 TAB) 10 MG (21) TBPK tablet  Daily        05/06/21 1148    metoCLOPramide (REGLAN) 5 MG tablet  Every 6 hours PRN        05/06/21 1150    diphenhydrAMINE (BENADRYL) 25 MG tablet  Every 6 hours PRN        05/06/21 1150              Discharge Instructions      Please follow up with gastroenterologist and pain medicine specialist.           Sloan Leiter, DO 05/06/21 1229

## 2021-05-06 NOTE — ED Notes (Signed)
Pt given ginger ale and crackers. Pt stated she flet a little weak but was ok. Pt also stated that she wanted to go home

## 2021-05-06 NOTE — ED Notes (Signed)
Pt states her pain is a 10 and requesting pain medication. MD notified. Currently unable to give IV medication until new IV started

## 2021-05-06 NOTE — Discharge Instructions (Addendum)
Please follow up with gastroenterologist and pain medicine specialist.

## 2021-05-06 NOTE — ED Triage Notes (Signed)
Complains with belly pain starting around 6:00 pm after eating dinner.

## 2021-05-06 NOTE — ED Provider Notes (Signed)
[facility] EMERGENCY DEPT Provider Note   CSN: [id number] Arrival date & time: 05/06/21  0403     History Chief Complaint  Patient presents with   Abdominal Pain    Kathy Fischer is a 80 y.o. female.  The history is provided by the patient.  Abdominal Pain She has history of hypertension, Crohn's disease and comes in with recurrent abdominal pain.  She was in the emergency department 3 days ago with abdominal pain and had a negative CT scan and was discharged with prescriptions for ondansetron and dicyclomine.  She had a few episodes of pain since then which were not severe, but had severe pain and started about 6 PM.  Tonight the pain occurred after eating some chicken broth.  There was minimal associated nausea.  Abdominal pain is generalized.  She has chronic back pain, so cannot tell if pain is radiating to the back.  She takes hydromorphone for her back pain but did not notice any improvement in her abdominal pain when she took it.  Husband is also concerned about possible urinary tract infection because she is urinating frequently but without any dysuria.  She has not had any fever or chills.  Pain was rated at 10/10 at its worst, it has improved to 7/10.  Also of note, blood pressure at home was markedly elevated to the range of 190/100.   Past Medical History:  Diagnosis Date   Crohn's disease (HCC)    Hypertension     Patient Active Problem List   Diagnosis Date Noted   Hypertensive urgency 12/30/2020   Acute cystitis 09/12/2020   Nausea & vomiting 09/12/2020   Generalized weakness 09/12/2020   Abdominal pain 09/12/2020   Hyponatremia 09/12/2020   Hypertension    Acquired hypothyroidism     History reviewed. No pertinent surgical history.   OB History   No obstetric history on file.     No family history on file.  Social History   Tobacco Use   Smoking status: Never   Smokeless tobacco: Never  Substance Use Topics   Alcohol use: Not  Currently   Drug use: Never    Home Medications Prior to Admission medications   Medication Sig Start Date End Date Taking? Authorizing Provider  buPROPion (WELLBUTRIN XL) 150 MG 24 hr tablet Take 1 tablet by mouth every morning. 05/02/20   [provider]  carisoprodol (SOMA) 350 MG tablet Take 350 mg by mouth 3 (three) times daily. 11/01/17   [provider]  Cholecalciferol 1.25 MG (50000 UT) capsule Take 1 capsule by mouth 2 (two) times a week. 03/22/18   [provider]  cloNIDine (CATAPRES) 0.2 MG tablet Take 1 tablet (0.2 mg total) by mouth 2 (two) times daily. 01/02/21   Marinda Elk, MD  dicyclomine (BENTYL) 20 MG tablet Take 1 tablet (20 mg total) by mouth every 6 (six) hours as needed. 09/13/20   Alwyn Ren, MD  dicyclomine (BENTYL) 20 MG tablet Take 1 tablet (20 mg total) by mouth 2 (two) times daily as needed for spasms. 05/04/21   Horton, Mayer Masker, MD  DULoxetine (CYMBALTA) 60 MG capsule Take 1 capsule by mouth daily. 08/04/20   [provider]  gabapentin (NEURONTIN) 300 MG capsule Take 1 capsule by mouth 4 (four) times daily. 05/21/20   [provider]  hydrochlorothiazide (MICROZIDE) 12.5 MG capsule Take 2 capsules (25 mg total) by mouth daily. 05/04/21   Horton, Mayer Masker, MD  levothyroxine (SYNTHROID) 75 MCG tablet  Take 75 mcg by mouth daily. 01/15/20   [provider]  lisinopril (ZESTRIL) 20 MG tablet Take 1 tablet (20 mg total) by mouth daily. 01/01/21   Charlynne Cousins, MD  meclizine (ANTIVERT) 25 MG tablet Take 25 mg by mouth 3 (three) times daily as needed for dizziness. 10/09/20 10/09/21  [provider]  naloxone [facility]) nasal spray 4 mg/0.1 mL Place 1 spray into the nose once as needed (overdose). one spray by Nasal route once as needed for up to 1 dose. Use per instruction in event of accidential over dose and call 911. 08/13/19   [provider]  omeprazole (PRILOSEC) 40 MG  capsule Take 1 capsule by mouth daily. 05/12/20   [provider]  ondansetron (ZOFRAN-ODT) 4 MG disintegrating tablet Take 1 tablet (4 mg total) by mouth every 8 (eight) hours as needed for nausea or vomiting. 05/04/21   Horton, Barbette Hair, MD  prochlorperazine (COMPAZINE) 5 MG tablet Take 1 tablet by mouth every 6 (six) hours as needed for nausea or vomiting. 03/31/20   [provider]  rosuvastatin (CRESTOR) 20 MG tablet Take 20 mg by mouth daily. 08/04/20   [provider]  zolpidem (AMBIEN) 10 MG tablet Take 10 mg by mouth at bedtime as needed for sleep. 05/20/19   [provider]    Allergies    Dexamethasone, Penicillins, and Sulfa antibiotics  Review of Systems   Review of Systems  Gastrointestinal:  Positive for abdominal pain.  All other systems reviewed and are negative.  Physical Exam Updated Vital Signs BP (!) 195/120 (BP Location: Left Arm)    Pulse 62    Temp 98.7 F (37.1 C) (Oral)    Resp 16    Ht 5' 3.5" (1.613 m)    Wt 70.3 kg    SpO2 99%    BMI 27.03 kg/m   Physical Exam Vitals and nursing note reviewed.  80 year old female, resting comfortably and in no acute distress. Vital signs are significant for elevated blood pressure. Oxygen saturation is 99%, which is normal. Head is normocephalic and atraumatic. PERRLA, EOMI. Oropharynx is clear. Neck is nontender and supple without adenopathy or JVD. Back is nontender and there is no CVA tenderness. Lungs are clear without rales, wheezes, or rhonchi. Chest is nontender. Heart has regular rate and rhythm without murmur. Abdomen is soft, flat, with moderate tenderness in the right upper and lower abdomen.  There is no rebound or guarding.  There are no masses or hepatosplenomegaly and peristalsis is hypoactive. Extremities have trace edema, full range of motion is present. Skin is warm and dry without rash. Neurologic: Mental status is normal, cranial nerves are intact, moves all  extremities equally.  ED Results / Procedures / Treatments   Labs (all labs ordered are listed, but only abnormal results are displayed) Labs Reviewed  COMPREHENSIVE METABOLIC PANEL - Abnormal; Notable for the following components:      Result Value   Sodium 130 (*)    CO2 19 (*)    Glucose, Bld 103 (*)    Creatinine, Ser 1.45 (*)    AST 46 (*)    GFR, Estimated 36 (*)    All other components within normal limits  LACTIC ACID, PLASMA - Abnormal; Notable for the following components:   Lactic Acid, Venous 2.5 (*)    All other components within normal limits  CBC WITH DIFFERENTIAL/PLATELET - Abnormal; Notable for the following components:   RBC 3.70 (*)  Hemoglobin 11.3 (*)    HCT 32.5 (*)    All other components within normal limits  LIPASE, BLOOD  URINALYSIS, ROUTINE W REFLEX MICROSCOPIC    EKG EKG Interpretation  Date/Time:  Wednesday May 06 2021 05:01:25 EST Ventricular Rate:  56 PR Interval:  185 QRS Duration: 156 QT Interval:  502 QTC Calculation: 485 R Axis:   -49 Text Interpretation: Sinus rhythm Atrial premature complex RBBB and LAFB Inferior infarct, old When compared with ECG of 05/03/2021, Premature atrial complexes are now present Confirmed by Delora Fuel ([id number]) on 05/06/2021 5:05:02 AM  Radiology No results found.  Procedures Procedures   Medications Ordered in ED Medications  sodium chloride 0.9 % bolus 500 mL (has no administration in time range)  ondansetron (ZOFRAN-ODT) disintegrating tablet 8 mg (8 mg Oral Given 05/06/21 0622)  HYDROmorphone (DILAUDID) injection 1 mg (1 mg Intravenous Given 05/06/21 [id number])    ED Course  I have reviewed the triage vital signs and the nursing notes.  Pertinent labs & imaging results that were available during my care of the patient were reviewed by me and considered in my medical decision making (see chart for details).   MDM Rules/Calculators/A&P                         Abdominal pain of uncertain  cause.  Old records are reviewed confirming ED visit on 12/11 at which time CT of abdomen and pelvis showed no acute process.  It is noted that she has calcified aorta and iliac vessels.  Since pain tonight came on after eating, I am concerned about possible mesenteric angina.  She will be sent for CT angiogram, and will check lactate level.  Lactic acid level has come back slightly elevated at 2.5.  Mild hyponatremia is present not significantly changed from prior.  Renal function is slightly worse.  Mild anemia is present with 0.5 g drop in hemoglobin over the last 3 days.  CT angiogram is pending.  ECG is unchanged from prior except for presence of PACs.  Case is signed out to Dr. Pearline Cables.  Final Clinical Impression(s) / ED Diagnoses Final diagnoses:  Generalized abdominal pain  Elevated lactic acid level  Hyponatremia  Renal insufficiency  Normochromic normocytic anemia    Rx / DC Orders ED Discharge Orders     None        Delora Fuel, MD [phone]

## 2021-05-06 NOTE — ED Notes (Signed)
Pt Bp cuff is on wrist. Pt does not want BP taken on upper arm. Pt states she will take medication ordered at home instead of taking here.

## 2021-05-06 NOTE — ED Notes (Signed)
Patient transported to CT 

## 2021-05-06 NOTE — ED Notes (Signed)
CT stated that they were having trouble with the IV and requested another IV be put in.

## 2021-05-13 ENCOUNTER — Other Ambulatory Visit: Payer: Self-pay

## 2021-05-13 ENCOUNTER — Encounter (HOSPITAL_BASED_OUTPATIENT_CLINIC_OR_DEPARTMENT_OTHER): Payer: Self-pay | Admitting: Family Medicine

## 2021-05-13 ENCOUNTER — Ambulatory Visit (INDEPENDENT_AMBULATORY_CARE_PROVIDER_SITE_OTHER): Payer: Medicare Other | Admitting: Family Medicine

## 2021-05-13 VITALS — BP 162/104 | HR 74 | Ht 63.5 in | Wt 152.0 lb

## 2021-05-13 DIAGNOSIS — R7989 Other specified abnormal findings of blood chemistry: Secondary | ICD-10-CM | POA: Insufficient documentation

## 2021-05-13 DIAGNOSIS — Z23 Encounter for immunization: Secondary | ICD-10-CM | POA: Diagnosis not present

## 2021-05-13 DIAGNOSIS — E871 Hypo-osmolality and hyponatremia: Secondary | ICD-10-CM | POA: Diagnosis not present

## 2021-05-13 DIAGNOSIS — I10 Essential (primary) hypertension: Secondary | ICD-10-CM | POA: Diagnosis not present

## 2021-05-13 MED ORDER — DICYCLOMINE HCL 20 MG PO TABS
20.0000 mg | ORAL_TABLET | Freq: Two times a day (BID) | ORAL | 0 refills | Status: DC | PRN
Start: 2021-05-13 — End: 2021-05-19

## 2021-05-13 MED ORDER — HYDROCHLOROTHIAZIDE 12.5 MG PO CAPS: 25.0000 mg | ORAL_CAPSULE | Freq: Every day | ORAL | 0 refills | Status: DC

## 2021-05-13 MED ORDER — CLONIDINE HCL 0.2 MG PO TABS: 0.2000 mg | ORAL_TABLET | Freq: Two times a day (BID) | ORAL | 0 refills | Status: DC

## 2021-05-13 NOTE — Progress Notes (Signed)
New Patient Office Visit  Subjective:  Patient ID: Kathy Fischer, female    DOB: 1941/03/21  Age: 80 y.o. MRN: [id number]  CC:  Chief Complaint  Patient presents with   Establish Care    Former PCP in [place] - Notes in Care everywhere   Abdominal Pain    Patient has been having intermittent abdominal pain which she thinks is attributed to her uncontrolled HTN. She states the last episode she had and went to the ED for she had been out of her hctz for 2-3 days and her BP shot up therefor her abdominal pain started.     HPI Kathy Fischer is an 80 year old female presenting to establish in clinic.  Patient has current concerns as outlined above.  Past medical history is significant for hypertension, hypothyroidism, chronic pain syndrome, lumbar degenerative disc disease, facet arthropathy, spinal stenosis of lumbar region.  She also reports history of Crohn's disease which results in severe abdominal pain which has been chronic for patient.  Hypertension: Current medications include clonidine, hydrochlorothiazide.  Reportedly was also on lisinopril this has been stopped, unclear reasoning, possibly related to underlying kidney function.  Hypothyroidism: Currently taking levothyroxine 75 mcg  Patient has been following with [facility] related to her chronic generalized pain.  Current regimen of medications includes Dilaudid, Soma, Cymbalta, gabapentin.  It does appear that at recent office visit about 1 month ago, there was discussion on trying tizanidine at night instead of Soma.  There is also discussion of planned procedure to help with lumbar spine pain.  She also follows with a sleep medicine provider given issues with insomnia in the past.  Currently prescribed Ambien to help with sleep.  Past Medical History:  Diagnosis Date   Arthritis    Right Knee   Chronic pain syndrome    Crohn's disease ([place])    Degenerative disc disease, lumbar    Facet arthropathy     Hypertension    Spinal stenosis of lumbar region at multiple levels    Trochanteric bursitis of both hips     History reviewed. No pertinent surgical history.  History reviewed. No pertinent family history.  Social History   Socioeconomic History   Marital status: Married    Spouse name: Not on file   Number of children: Not on file   Years of education: Not on file   Highest education level: Not on file  Occupational History   Not on file  Tobacco Use   Smoking status: Never   Smokeless tobacco: Never  Vaping Use   Vaping Use: Never used  Substance and Sexual Activity   Alcohol use: Not Currently   Drug use: Never   Sexual activity: Not Currently  Other Topics Concern   Not on file  Social History Narrative   Not on file   Social Determinants of Health   Financial Resource Strain: Not on file  Food Insecurity: Not on file  Transportation Needs: Not on file  Physical Activity: Not on file  Stress: Not on file  Social Connections: Not on file  Intimate Partner Violence: Not on file    Objective:   Today's Vitals: BP (!) 162/104    Pulse 74    Ht 5' 3.5" (1.613 m)    Wt 152 lb (68.9 kg)    SpO2 98%    BMI 26.50 kg/m   Physical Exam  80 year old female in no acute distress Cardiovascular exam with regular rate and rhythm Lungs clear to auscultation  bilaterally  Assessment & Plan:   Problem List Items Addressed This Visit       Cardiovascular and Mediastinum   Hypertension    Blood pressure elevated in office today Given underlying chronic hyponatremia, impaired kidney function, will refer to nephrology for further evaluation and recommendations Continue with current regimen      Relevant Medications   hydrochlorothiazide (MICROZIDE) 12.5 MG capsule   Other Relevant Orders   Ambulatory referral to Nephrology     Other   Hyponatremia - Primary    This appears to be chronic for patient on review of labs.  Level of hyponatremia has been  mild Will refer to nephrology for further evaluation and recommendations      Relevant Orders   Basic metabolic panel (Completed)   Ambulatory referral to Nephrology   Elevated serum creatinine    Has had elevation of creatinine recently, most recent lab was 1.45.  Will repeat BMP to assess chronicity, stability Will refer to nephrology given elevated creatinine in setting of hyponatremia, hypertension      Relevant Orders   Ambulatory referral to Nephrology   Other Visit Diagnoses     Encounter for immunization       Relevant Orders   Flu Vaccine QUAD High Dose(Fluad) (Completed)       Outpatient Encounter Medications as of 05/13/2021  Medication Sig   carisoprodol (SOMA) 350 MG tablet Take 350 mg by mouth 3 (three) times daily.   Cholecalciferol 1.25 MG (50000 UT) capsule Take 1 capsule by mouth 2 (two) times a week.   diphenhydrAMINE (BENADRYL) 25 MG tablet Take 1 tablet (25 mg total) by mouth every 6 (six) hours as needed for up to 10 doses (take with reglan/metoclopramide).   gabapentin (NEURONTIN) 300 MG capsule Take 1 capsule by mouth 4 (four) times daily.   metoCLOPramide (REGLAN) 5 MG tablet Take 1 tablet (5 mg total) by mouth every 6 (six) hours as needed for nausea (take with benadryl).   naloxone (NARCAN) nasal spray 4 mg/0.1 mL Place 1 spray into the nose once as needed (overdose). one spray by Nasal route once as needed for up to 1 dose. Use per instruction in event of accidential over dose and call 911.   omeprazole (PRILOSEC) 40 MG capsule Take 1 capsule by mouth daily.   predniSONE (STERAPRED UNI-PAK 21 TAB) 10 MG (21) TBPK tablet Take by mouth daily. Take 6 tabs by mouth daily  for 2 days, then 5 tabs for 2 days, then 4 tabs for 2 days, then 3 tabs for 2 days, 2 tabs for 2 days, then 1 tab by mouth daily for 2 days   prochlorperazine (COMPAZINE) 5 MG tablet Take 1 tablet by mouth every 6 (six) hours as needed for nausea or vomiting.   rosuvastatin (CRESTOR) 20 MG  tablet Take 20 mg by mouth daily.   zolpidem (AMBIEN) 10 MG tablet Take 10 mg by mouth at bedtime as needed for sleep.   [DISCONTINUED] buPROPion (WELLBUTRIN XL) 150 MG 24 hr tablet Take 1 tablet by mouth every morning.   [DISCONTINUED] cloNIDine (CATAPRES) 0.2 MG tablet Take 1 tablet (0.2 mg total) by mouth 2 (two) times daily.   [DISCONTINUED] dicyclomine (BENTYL) 20 MG tablet Take 1 tablet (20 mg total) by mouth 2 (two) times daily as needed for spasms.   [DISCONTINUED] DULoxetine (CYMBALTA) 60 MG capsule Take 1 capsule by mouth daily.   [DISCONTINUED] hydrochlorothiazide (MICROZIDE) 12.5 MG capsule Take 2 capsules (25 mg total) by mouth  daily.   [DISCONTINUED] levothyroxine (SYNTHROID) 75 MCG tablet Take 75 mcg by mouth daily.   [DISCONTINUED] meclizine (ANTIVERT) 25 MG tablet Take 25 mg by mouth 3 (three) times daily as needed for dizziness.   hydrochlorothiazide (MICROZIDE) 12.5 MG capsule Take 2 capsules (25 mg total) by mouth daily.   [DISCONTINUED] cloNIDine (CATAPRES) 0.2 MG tablet Take 1 tablet (0.2 mg total) by mouth 2 (two) times daily.   [DISCONTINUED] dicyclomine (BENTYL) 20 MG tablet Take 1 tablet (20 mg total) by mouth every 6 (six) hours as needed. (Patient not taking: Reported on 05/13/2021)   [DISCONTINUED] dicyclomine (BENTYL) 20 MG tablet Take 1 tablet (20 mg total) by mouth 2 (two) times daily as needed for spasms.   [DISCONTINUED] lisinopril (ZESTRIL) 20 MG tablet Take 1 tablet (20 mg total) by mouth daily. (Patient not taking: Reported on 05/13/2021)   [DISCONTINUED] ondansetron (ZOFRAN-ODT) 4 MG disintegrating tablet Take 1 tablet (4 mg total) by mouth every 8 (eight) hours as needed for nausea or vomiting. (Patient not taking: Reported on 05/13/2021)   No facility-administered encounter medications on file as of 05/13/2021.    Follow-up: No follow-ups on file.   Lihanna Biever J [place], MD

## 2021-05-13 NOTE — Patient Instructions (Signed)
°  Medication Instructions:  Your physician recommends that you continue on your current medications as directed. Please refer to the Current Medication list given to you today. --If you need a refill on any your medications before your next appointment, please call your pharmacy first. If no refills are authorized on file call the office.-- Lab Work: Your physician has recommended that you have lab work today: BMET If you have labs (blood work) drawn today and your tests are completely normal, you will receive your results via MyChart message OR a phone call from our staff.  Please ensure you check your voicemail in the event that you authorized detailed messages to be left on a delegated number. If you have any lab test that is abnormal or we need to change your treatment, we will call you to review the results.  Referrals/Procedures/Imaging: A referral has been placed for you to [facility] for evaluation and treatment. Someone from the scheduling department will be in contact with you in regards to coordinating your consultation. If you do not hear from any of the schedulers within 7-10 business days please give their office a call.  [facility] [address] [place] [address] [phone]  Follow-Up: Your next appointment:   Your physician recommends that you schedule a follow-up appointment in: 6-8 WEEKS with Dr. de [place]  You will receive a text message or e-mail with a link to a survey about your care and experience with [place] today! We would greatly appreciate your feedback!   Thanks for letting us be apart of your health journey!!  Primary Care and Sports Medicine   Dr. Ceasar Mons [place]   We encourage you to activate your patient portal called "MyChart".  Sign up information is provided on this After Visit Summary.  MyChart is used to connect with patients for Virtual Visits (Telemedicine).  Patients are able to view lab/test results, encounter notes, upcoming  appointments, etc.  Non-urgent messages can be sent to your provider as well. To learn more about what you can do with MyChart, please visit --  [email or web address].

## 2021-05-19 ENCOUNTER — Telehealth (HOSPITAL_BASED_OUTPATIENT_CLINIC_OR_DEPARTMENT_OTHER): Payer: Self-pay | Admitting: Family Medicine

## 2021-05-19 ENCOUNTER — Ambulatory Visit (HOSPITAL_BASED_OUTPATIENT_CLINIC_OR_DEPARTMENT_OTHER): Payer: Medicare Other

## 2021-05-19 ENCOUNTER — Other Ambulatory Visit: Payer: Self-pay

## 2021-05-19 MED ORDER — LEVOTHYROXINE SODIUM 75 MCG PO TABS
75.0000 ug | ORAL_TABLET | Freq: Every day | ORAL | 2 refills | Status: DC
Start: 2021-05-19 — End: 2021-08-17

## 2021-05-19 MED ORDER — DULOXETINE HCL 60 MG PO CPEP: 60.0000 mg | ORAL_CAPSULE | Freq: Every day | ORAL | 2 refills | Status: DC

## 2021-05-19 MED ORDER — DICYCLOMINE HCL 20 MG PO TABS: 20.0000 mg | ORAL_TABLET | Freq: Two times a day (BID) | ORAL | 0 refills | Status: DC | PRN

## 2021-05-19 MED ORDER — BUPROPION HCL ER (XL) 150 MG PO TB24: 150.0000 mg | ORAL_TABLET | Freq: Every morning | ORAL | 2 refills | Status: AC

## 2021-05-19 MED ORDER — MECLIZINE HCL 25 MG PO TABS: 25.0000 mg | ORAL_TABLET | Freq: Three times a day (TID) | ORAL | 2 refills | Status: DC | PRN

## 2021-05-19 MED ORDER — CLONIDINE HCL 0.2 MG PO TABS: 0.2000 mg | ORAL_TABLET | Freq: Two times a day (BID) | ORAL | 2 refills | Status: DC

## 2021-05-19 NOTE — Telephone Encounter (Signed)
Pt is needing these medications refilled and sent to the pharmacy. Was no done at her NP appt on 12/21. (**) Are beside Rx that is needed asap.  **dicyclomine (BENTYL) 20 MG tablet [[id number]]  (Pharmacy did not give enough due to not having enough in stock)  **levothyroxine (SYNTHROID) 75 MCG tablet [[id number]]    **DULoxetine (CYMBALTA) 60 MG capsule [[id number]]  meclizine (ANTIVERT) 25 MG tablet [[id number]]  **rosuvastatin (CRESTOR) 20 MG tablet [[id number]]   **cloNIDine (CATAPRES) 0.2 MG tablet [[id number]]  **meclizine (ANTIVERT) 25 MG tablet [[id number]]  **buPROPion (WELLBUTRIN XL) 150 MG 24 hr tablet [[id number]]   Pt is needing these sent to: [id number] - [place], [place] - [address] AT [address] [facility] [address]  Please advise.

## 2021-05-19 NOTE — Telephone Encounter (Signed)
Patient's husband and patient are coming to the office for blood work and will bring list of medications that they are requesting

## 2021-05-19 NOTE — Telephone Encounter (Signed)
Pt is needing her medications refilled. Pt will be by later with a list of medication that needed to be refilled. Please advise.

## 2021-05-20 LAB — BASIC METABOLIC PANEL
BUN/Creatinine Ratio: 26 (ref 12–28)
BUN: 32 mg/dL — ABNORMAL HIGH (ref 8–27)
CO2: 21 mmol/L (ref 20–29)
Calcium: 9.6 mg/dL (ref 8.7–10.3)
Chloride: 94 mmol/L — ABNORMAL LOW (ref 96–106)
Creatinine, Ser: 1.22 mg/dL — ABNORMAL HIGH (ref 0.57–1.00)
Glucose: 127 mg/dL — ABNORMAL HIGH (ref 70–99)
Potassium: 5.2 mmol/L (ref 3.5–5.2)
Sodium: 132 mmol/L — ABNORMAL LOW (ref 134–144)
eGFR: 45 mL/min/{1.73_m2} — ABNORMAL LOW (ref >59)

## 2021-05-22 NOTE — Assessment & Plan Note (Signed)
Has had elevation of creatinine recently, most recent lab was 1.45.  Will repeat BMP to assess chronicity, stability Will refer to nephrology given elevated creatinine in setting of hyponatremia, hypertension

## 2021-05-22 NOTE — Assessment & Plan Note (Signed)
This appears to be chronic for patient on review of labs.  Level of hyponatremia has been mild Will refer to nephrology for further evaluation and recommendations

## 2021-05-22 NOTE — Assessment & Plan Note (Signed)
Blood pressure elevated in office today Given underlying chronic hyponatremia, impaired kidney function, will refer to nephrology for further evaluation and recommendations Continue with current regimen

## 2021-06-01 ENCOUNTER — Other Ambulatory Visit (HOSPITAL_BASED_OUTPATIENT_CLINIC_OR_DEPARTMENT_OTHER): Payer: Self-pay

## 2021-06-01 MED ORDER — ROSUVASTATIN CALCIUM 20 MG PO TABS: 20.0000 mg | ORAL_TABLET | Freq: Every day | ORAL | 1 refills | Status: DC

## 2021-06-11 ENCOUNTER — Other Ambulatory Visit (HOSPITAL_BASED_OUTPATIENT_CLINIC_OR_DEPARTMENT_OTHER): Payer: Self-pay | Admitting: Family Medicine

## 2021-06-15 ENCOUNTER — Encounter (HOSPITAL_COMMUNITY): Payer: Self-pay

## 2021-06-15 ENCOUNTER — Ambulatory Visit (HOSPITAL_COMMUNITY)
Admission: RE | Admit: 2021-06-15 | Discharge: 2021-06-15 | Disposition: A | Discharge status: Home / Self Care | Payer: Medicare Other | Source: Ambulatory Visit | Attending: Student | Admitting: Student

## 2021-06-15 ENCOUNTER — Other Ambulatory Visit: Payer: Self-pay

## 2021-06-15 VITALS — BP 139/82 | HR 70 | Temp 97.9°F | Resp 18

## 2021-06-15 DIAGNOSIS — K50919 Crohn's disease, unspecified, with unspecified complications: Secondary | ICD-10-CM

## 2021-06-15 DIAGNOSIS — R1013 Epigastric pain: Secondary | ICD-10-CM

## 2021-06-15 DIAGNOSIS — H65192 Other acute nonsuppurative otitis media, left ear: Secondary | ICD-10-CM

## 2021-06-15 MED ORDER — MECLIZINE HCL 25 MG PO TABS: 25.0000 mg | ORAL_TABLET | Freq: Three times a day (TID) | ORAL | 0 refills | Status: DC | PRN

## 2021-06-15 MED ORDER — DICYCLOMINE HCL 20 MG PO TABS: 20.0000 mg | ORAL_TABLET | Freq: Two times a day (BID) | ORAL | 0 refills | Status: DC | PRN

## 2021-06-15 NOTE — ED Provider Notes (Signed)
[name]    CSN: [id number] Arrival date & time: 06/15/21  1552      History   Chief Complaint Chief Complaint  Patient presents with   appt 4    Abdominal Pain         HPI Kathy Fischer is a 81 y.o. female presenting with abdominal pain for 4 days following taking hydromorphone for back pain.  Medical history Crohn's disease, chronic pain syndrome, spinal stenosis. Has tolerated hydromorphone in the past. Has limited the hydromorphone since then- only one pill daily. States she also went out to eat the day before this started and ate some onion soup. Takes colace daily, last BM was one day ago and was normal, no blood.  States she is having generalized abdominal pain worse in epigastric region for about 4 days, this is not associated with nausea, vomiting, diarrhea.  States that she requires a refill on the Bentyl for this, which has worked for her in the past.  She is adamant that she requires at least 60 pills of Bentyl.  Also states that she is having unbearable pressure on her left ear associated with some dizziness and vertigo.  She is certain that she has wax in the ear, and is requesting at least 30 meclizine.  Denies headaches, vision changes, chest pain, shortness of breath, weakness.  HPI  Past Medical History:  Diagnosis Date   Arthritis    Right Knee   Chronic pain syndrome    Crohn's disease ([place])    Degenerative disc disease, lumbar    Facet arthropathy    Hypertension    Spinal stenosis of lumbar region at multiple levels    Trochanteric bursitis of both hips     Patient Active Problem List   Diagnosis Date Noted   Elevated serum creatinine 05/13/2021   Hypertensive urgency 12/30/2020   Acute cystitis 09/12/2020   Nausea & vomiting 09/12/2020   Generalized weakness 09/12/2020   Abdominal pain 09/12/2020   Hyponatremia 09/12/2020   Hypertension    Acquired hypothyroidism     History reviewed. No pertinent surgical history.  OB History    No obstetric history on file.      Home Medications    Prior to Admission medications   Medication Sig Start Date End Date Taking? Authorizing Provider  meclizine (ANTIVERT) 25 MG tablet Take 1 tablet (25 mg total) by mouth 3 (three) times daily as needed for dizziness. 06/15/21  Yes Hazel Sams, PA-C  buPROPion (WELLBUTRIN XL) 150 MG 24 hr tablet Take 1 tablet (150 mg total) by mouth every morning. 05/19/21   de [place], Raymond J, MD  carisoprodol (SOMA) 350 MG tablet Take 350 mg by mouth 3 (three) times daily. 11/01/17   [provider]  Cholecalciferol 1.25 MG (50000 UT) capsule Take 1 capsule by mouth 2 (two) times a week. 03/22/18   [provider]  cloNIDine (CATAPRES) 0.2 MG tablet Take 1 tablet (0.2 mg total) by mouth 2 (two) times daily. 05/19/21   [place], Blondell Reveal, MD  dicyclomine (BENTYL) 20 MG tablet Take 1 tablet (20 mg total) by mouth 2 (two) times daily as needed for spasms. 06/15/21   Hazel Sams, PA-C  diphenhydrAMINE (BENADRYL) 25 MG tablet Take 1 tablet (25 mg total) by mouth every 6 (six) hours as needed for up to 10 doses (take with reglan/metoclopramide). 05/06/21   Jeanell Sparrow, DO  DULoxetine (CYMBALTA) 60 MG capsule Take 1 capsule (60 mg total) by  mouth daily. 05/19/21   [place], Raymond J, MD  gabapentin (NEURONTIN) 300 MG capsule Take 1 capsule by mouth 4 (four) times daily. 05/21/20   [provider]  hydrochlorothiazide (MICROZIDE) 12.5 MG capsule Take 2 capsules (25 mg total) by mouth daily. 05/13/21   [place], Raymond J, MD  levothyroxine (SYNTHROID) 75 MCG tablet Take 1 tablet (75 mcg total) by mouth daily. 05/19/21   [place], Blondell Reveal, MD  metoCLOPramide (REGLAN) 5 MG tablet Take 1 tablet (5 mg total) by mouth every 6 (six) hours as needed for nausea (take with benadryl). 05/06/21   Jeanell Sparrow, DO  naloxone [facility]) nasal spray 4 mg/0.1 mL Place 1 spray into the nose once as needed (overdose). one spray by Nasal route  once as needed for up to 1 dose. Use per instruction in event of accidential over dose and call 911. 08/13/19   [provider]  omeprazole (PRILOSEC) 40 MG capsule Take 1 capsule by mouth daily. 05/12/20   [provider]  predniSONE (STERAPRED UNI-PAK 21 TAB) 10 MG (21) TBPK tablet Take by mouth daily. Take 6 tabs by mouth daily  for 2 days, then 5 tabs for 2 days, then 4 tabs for 2 days, then 3 tabs for 2 days, 2 tabs for 2 days, then 1 tab by mouth daily for 2 days 05/06/21   Jeanell Sparrow, DO  prochlorperazine (COMPAZINE) 5 MG tablet Take 1 tablet by mouth every 6 (six) hours as needed for nausea or vomiting. 03/31/20   [provider]  rosuvastatin (CRESTOR) 20 MG tablet Take 1 tablet (20 mg total) by mouth daily. 06/01/21   [place], Blondell Reveal, MD  zolpidem (AMBIEN) 10 MG tablet Take 10 mg by mouth at bedtime as needed for sleep. 05/20/19   [provider]    Family History History reviewed. No pertinent family history.  Social History Social History   Tobacco Use   Smoking status: Never   Smokeless tobacco: Never  Vaping Use   Vaping Use: Never used  Substance Use Topics   Alcohol use: Not Currently   Drug use: Never     Allergies   Dexamethasone, Penicillins, and Sulfa antibiotics   Review of Systems Review of Systems  Gastrointestinal:  Positive for abdominal pain.  Neurological:  Positive for dizziness.  All other systems reviewed and are negative.   Physical Exam Triage Vital Signs ED Triage Vitals  Enc Vitals Group     BP 06/15/21 1641 139/82     Pulse Rate 06/15/21 1641 70     Resp 06/15/21 1641 18     Temp 06/15/21 1641 97.9 F (36.6 C)     Temp src --      SpO2 06/15/21 1641 100 %     Weight --      Height --      Head Circumference --      Peak Flow --      Pain Score 06/15/21 1642 8     Pain Loc --      Pain Edu? --      Excl. in [place]? --    No data found.  Updated Vital Signs BP 139/82 (BP Location: Left Arm)     Pulse 70    Temp 97.9 F (36.6 C)    Resp 18    SpO2 100%   Visual Acuity Right Eye Distance:   Left Eye Distance:   Bilateral Distance:    Right Eye  Near:   Left Eye Near:    Bilateral Near:     Physical Exam Vitals reviewed.  Constitutional:      General: She is not in acute distress.    Appearance: Normal appearance. She is not ill-appearing.  HENT:     Head: Normocephalic and atraumatic.     Right Ear: Hearing, tympanic membrane, ear canal and external ear normal. No swelling or tenderness. No middle ear effusion. There is no impacted cerumen. No mastoid tenderness. Tympanic membrane is not injected, scarred, perforated, erythematous, retracted or bulging.     Left Ear: Hearing, ear canal and external ear normal. No swelling or tenderness. A middle ear effusion is present. There is no impacted cerumen. No mastoid tenderness. Tympanic membrane is not injected, scarred, perforated, erythematous, retracted or bulging.     Ears:     Comments: No infection present     Mouth/Throat:     Mouth: Mucous membranes are moist.     Pharynx: Oropharynx is clear. No oropharyngeal exudate or posterior oropharyngeal erythema.     Comments: Moist mucous membranes Eyes:     Extraocular Movements: Extraocular movements intact.     Pupils: Pupils are equal, round, and reactive to light.  Cardiovascular:     Rate and Rhythm: Normal rate and regular rhythm.     Heart sounds: Normal heart sounds.  Pulmonary:     Effort: Pulmonary effort is normal.     Breath sounds: Normal breath sounds. No wheezing, rhonchi or rales.  Abdominal:     General: Bowel sounds are normal. There is no distension.     Palpations: Abdomen is soft. There is no mass.     Tenderness: There is abdominal tenderness in the epigastric area. There is no right CVA tenderness, left CVA tenderness, guarding or rebound. Negative signs include Murphy's sign, Rovsing's sign and McBurney's sign.     Comments: Minimal tenderness to  palpation, comfortable throughout exam.  Lymphadenopathy:     Cervical: No cervical adenopathy.  Skin:    General: Skin is warm.     Capillary Refill: Capillary refill takes less than 2 seconds.     Comments: Good skin turgor  Neurological:     General: No focal deficit present.     Mental Status: She is alert and oriented to person, place, and time.     Cranial Nerves: Cranial nerves 2-12 are intact. No cranial nerve deficit.     Sensory: Sensation is intact. No sensory deficit.     Motor: Motor function is intact. No weakness.     Coordination: Coordination is intact. Romberg sign negative.     Comments: PERRLA, EOMI.  Psychiatric:        Mood and Affect: Mood normal.        Behavior: Behavior normal.        Thought Content: Thought content normal.        Judgment: Judgment normal.     UC Treatments / Results  Labs (all labs ordered are listed, but only abnormal results are displayed) Labs Reviewed - No data to display  EKG   Radiology No results found.  Procedures Procedures (including critical care time)  Medications Ordered in UC Medications - No data to display  Initial Impression / Assessment and Plan / UC Course  I have reviewed the triage vital signs and the nursing notes.  Pertinent labs & imaging results that were available during my care of the patient were reviewed by me and considered in my  medical decision making (see chart for details).     This patient is a very pleasant 81 y.o. year old female presenting with abd pain following taking Dilaudid that she is prescribed for chronic back pain.  BMs are regular and I have low concern for SBO. She has a history of Crohn's, and experiences pain like this frequently, but is out of her Bentyl.  On exam, there is minimal pain.  Discussed that while I cannot rule out an acute abdomen at urgent care, I am amenable to sending Bentyl as requested.  Patient is adamant she needs at least 60 tabs, discussed that I would  not prescribe this volume in urgent care.  She also incidentally has left middle ear effusion with some dizziness, she has dealt with this in the past and is requesting meclizine, also discussed that I will not prescribe more than 21 tabs of this as she should see her specialist if symptoms persist. Strict ED return precautions discussed. Patient verbalizes understanding and agreement.  .   Final Clinical Impressions(s) / UC Diagnoses   Final diagnoses:  Epigastric pain  Crohn's disease with complication, unspecified gastrointestinal tract location [facility])  Acute effusion of left ear     Discharge Instructions      -Bentyl as needed while symptoms persist -Meclizine as needed up to 3x daily for dizziness. This medication will cause drowsiness.  -Follow-up with your ear nose and throat doctor if your ear pain persists.  You do not have wax in the ear today. -If your abdominal pain gets worse, head to the emergency department.  I cannot rule out an acute abdominal issue today at urgent care.  If your symptoms persist, follow-up with your gastroenterologist.     ED Prescriptions     Medication Sig Dispense Auth. Provider   dicyclomine (BENTYL) 20 MG tablet Take 1 tablet (20 mg total) by mouth 2 (two) times daily as needed for spasms. 21 tablet Hazel Sams, PA-C   meclizine (ANTIVERT) 25 MG tablet Take 1 tablet (25 mg total) by mouth 3 (three) times daily as needed for dizziness. 21 tablet Hazel Sams, PA-C      PDMP not reviewed this encounter.   Hazel Sams, PA-C 06/15/21 1711

## 2021-06-15 NOTE — ED Triage Notes (Addendum)
Pt c/o upper abdominal pain since Friday after taking her hydromorphone for her back pain. States having decrease appetite. States hx of chron's and usually bentyl helps but she is out of it.

## 2021-06-15 NOTE — Discharge Instructions (Addendum)
-  Bentyl as needed while symptoms persist -Meclizine as needed up to 3x daily for dizziness. This medication will cause drowsiness.  -Follow-up with your ear nose and throat doctor if your ear pain persists.  You do not have wax in the ear today. -If your abdominal pain gets worse, head to the emergency department.  I cannot rule out an acute abdominal issue today at urgent care.  If your symptoms persist, follow-up with your gastroenterologist.

## 2021-07-08 ENCOUNTER — Other Ambulatory Visit: Payer: Self-pay

## 2021-07-08 ENCOUNTER — Ambulatory Visit (INDEPENDENT_AMBULATORY_CARE_PROVIDER_SITE_OTHER): Payer: Medicare Other | Admitting: Family Medicine

## 2021-07-08 ENCOUNTER — Encounter (HOSPITAL_BASED_OUTPATIENT_CLINIC_OR_DEPARTMENT_OTHER): Payer: Self-pay | Admitting: Family Medicine

## 2021-07-08 VITALS — BP 172/112 | HR 74 | Ht 63.25 in | Wt 159.0 lb

## 2021-07-08 DIAGNOSIS — R42 Dizziness and giddiness: Secondary | ICD-10-CM | POA: Diagnosis not present

## 2021-07-08 DIAGNOSIS — R109 Unspecified abdominal pain: Secondary | ICD-10-CM | POA: Diagnosis not present

## 2021-07-08 DIAGNOSIS — E871 Hypo-osmolality and hyponatremia: Secondary | ICD-10-CM | POA: Diagnosis not present

## 2021-07-08 DIAGNOSIS — H6592 Unspecified nonsuppurative otitis media, left ear: Secondary | ICD-10-CM

## 2021-07-08 DIAGNOSIS — H6590 Unspecified nonsuppurative otitis media, unspecified ear: Secondary | ICD-10-CM | POA: Insufficient documentation

## 2021-07-08 MED ORDER — HYDROCHLOROTHIAZIDE 12.5 MG PO CAPS: 25.0000 mg | ORAL_CAPSULE | Freq: Every day | ORAL | 1 refills | Status: DC

## 2021-07-08 MED ORDER — MECLIZINE HCL 25 MG PO TABS: 25.0000 mg | ORAL_TABLET | Freq: Three times a day (TID) | ORAL | 0 refills | Status: DC | PRN

## 2021-07-08 MED ORDER — DICYCLOMINE HCL 20 MG PO TABS: 20.0000 mg | ORAL_TABLET | Freq: Two times a day (BID) | ORAL | 1 refills | Status: DC | PRN

## 2021-07-08 NOTE — Patient Instructions (Signed)
°  Medication Instructions:  Your physician recommends that you continue on your current medications as directed. Please refer to the Current Medication list given to you today. --If you need a refill on any your medications before your next appointment, please call your pharmacy first. If no refills are authorized on file call the office.--  Referrals/Procedures/Imaging: A referral has been placed for you to [facility] for evaluation and treatment. Someone from the scheduling department will be in contact with you in regards to coordinating your consultation. If you do not hear from any of the schedulers within 7-10 business days please give their office a call.  A referral has been placed for you to [facility], Nose, and Throat for evaluation and treatment. Someone from the scheduling department will be in contact with you in regards to coordinating your consultation. If you do not hear from any of the schedulers within 7-10 business days please give their office a call.   Follow-Up: Your next appointment:   Your physician recommends that you schedule a follow-up appointment in: 2-3 MONTHS with Dr. de [place]  You will receive a text message or e-mail with a link to a survey about your care and experience with [place] today! We would greatly appreciate your feedback!   Thanks for letting us be apart of your health journey!!  Primary Care and Sports Medicine   Dr. Ceasar Mons [place]   We encourage you to activate your patient portal called "MyChart".  Sign up information is provided on this After Visit Summary.  MyChart is used to connect with patients for Virtual Visits (Telemedicine).  Patients are able to view lab/test results, encounter notes, upcoming appointments, etc.  Non-urgent messages can be sent to your provider as well. To learn more about what you can do with MyChart, please visit --  [email or web address].

## 2021-07-08 NOTE — Progress Notes (Signed)
° ° °  Procedures performed today:    None.  Independent interpretation of notes and tests performed by another provider:   None.  Brief History, Exam, Impression, and Recommendations:    BP (!) 172/112    Pulse 74    Ht 5' 3.25" (1.607 m)    Wt 159 lb (72.1 kg)    SpO2 98%    BMI 27.94 kg/m   Middle ear effusion Patient endorses feeling left ear pressure and as though it is "clogged" for about 3 weeks.  She has been using OTC drops to help clean the ear, has not had significant relief with this.  Denies any change in auditory acuity On exam, normal-appearing external auditory canal, no significant cerumen, tympanic membrane intact, mild amount of serous fluid behind tympanic membrane Discussed findings on exam, no impacted cerumen at present Given middle ear effusion with associated dizziness/balance issues, will refer to ENT for further evaluation and recommendations  Hyponatremia Noted on prior labs with associated elevated serum creatinine Was referred to nephrology previously, has not established as of yet  Abdominal pain Ongoing issue for patient recently, feels that symptoms are more noticeable when she takes her prescribed Dilaudid in the morning without food.  She seems a loss with this given that she has taken Dilaudid for many years now and has not had similar issues before.  She does report a history of Crohn's.  Reports it has been many years since she last saw a GI specialist. Given ongoing issues with abdominal pain and no recent follow-up with GI, will refer for patient to establish and for further recommendations from specialist  Spent 30 minutes on this patient encounter, including preparation, chart review, face-to-face counseling with patient and coordination of care, and documentation of encounter  Plan for follow-up in about 2 to 3 months or sooner as needed   ___________________________________________ Cova Knieriem de [place], MD, ABFM, CAQSM Primary Care and Sports  Medicine [facility]

## 2021-07-08 NOTE — Assessment & Plan Note (Signed)
Noted on prior labs with associated elevated serum creatinine Was referred to nephrology previously, has not established as of yet

## 2021-07-08 NOTE — Assessment & Plan Note (Signed)
Ongoing issue for patient recently, feels that symptoms are more noticeable when she takes her prescribed Dilaudid in the morning without food.  She seems a loss with this given that she has taken Dilaudid for many years now and has not had similar issues before.  She does report a history of Crohn's.  Reports it has been many years since she last saw a GI specialist. Given ongoing issues with abdominal pain and no recent follow-up with GI, will refer for patient to establish and for further recommendations from specialist

## 2021-07-08 NOTE — Assessment & Plan Note (Signed)
Patient endorses feeling left ear pressure and as though it is "clogged" for about 3 weeks.  She has been using OTC drops to help clean the ear, has not had significant relief with this.  Denies any change in auditory acuity On exam, normal-appearing external auditory canal, no significant cerumen, tympanic membrane intact, mild amount of serous fluid behind tympanic membrane Discussed findings on exam, no impacted cerumen at present Given middle ear effusion with associated dizziness/balance issues, will refer to ENT for further evaluation and recommendations

## 2021-08-14 ENCOUNTER — Other Ambulatory Visit (HOSPITAL_BASED_OUTPATIENT_CLINIC_OR_DEPARTMENT_OTHER): Payer: Self-pay | Admitting: Family Medicine

## 2021-09-02 ENCOUNTER — Other Ambulatory Visit (HOSPITAL_BASED_OUTPATIENT_CLINIC_OR_DEPARTMENT_OTHER): Payer: Self-pay | Admitting: Family Medicine

## 2021-09-02 ENCOUNTER — Telehealth (HOSPITAL_BASED_OUTPATIENT_CLINIC_OR_DEPARTMENT_OTHER): Payer: Self-pay | Admitting: Family Medicine

## 2021-09-02 DIAGNOSIS — R109 Unspecified abdominal pain: Secondary | ICD-10-CM

## 2021-09-02 NOTE — Telephone Encounter (Signed)
Pts husband called to update on a new pharmacy do to the pt moving. Pt is also needing a refill on  ?dicyclomine (BENTYL) 20 MG tablet [[id number]]  ? ?[facility] pharmacy is: ?[facility] [address], [place], [place] [address] ? ? ?Please advise. ?

## 2021-09-03 MED ORDER — DICYCLOMINE HCL 20 MG PO TABS: 20.0000 mg | ORAL_TABLET | Freq: Two times a day (BID) | ORAL | 0 refills | Status: AC | PRN

## 2021-09-03 NOTE — Telephone Encounter (Signed)
RX refill sent to pharmacy. AS, CMA 

## 2021-09-03 NOTE — Telephone Encounter (Signed)
Patient pharmacy updated. Refill sent ?

## 2021-09-04 ENCOUNTER — Other Ambulatory Visit (HOSPITAL_BASED_OUTPATIENT_CLINIC_OR_DEPARTMENT_OTHER): Payer: Self-pay | Admitting: Family Medicine

## 2021-09-23 ENCOUNTER — Other Ambulatory Visit (HOSPITAL_BASED_OUTPATIENT_CLINIC_OR_DEPARTMENT_OTHER): Payer: Self-pay | Admitting: Family Medicine

## 2021-09-28 ENCOUNTER — Other Ambulatory Visit (HOSPITAL_BASED_OUTPATIENT_CLINIC_OR_DEPARTMENT_OTHER): Payer: Self-pay | Admitting: Family Medicine

## 2021-09-29 ENCOUNTER — Other Ambulatory Visit (HOSPITAL_BASED_OUTPATIENT_CLINIC_OR_DEPARTMENT_OTHER): Payer: Self-pay

## 2021-09-29 ENCOUNTER — Telehealth (HOSPITAL_BASED_OUTPATIENT_CLINIC_OR_DEPARTMENT_OTHER): Payer: Self-pay

## 2021-09-29 DIAGNOSIS — R42 Dizziness and giddiness: Secondary | ICD-10-CM

## 2021-09-29 MED ORDER — MECLIZINE HCL 25 MG PO TABS: ORAL_TABLET | ORAL | 0 refills | Status: DC

## 2021-09-29 NOTE — Telephone Encounter (Signed)
Pt requested a refill on meclizine. I went ahead and sent the refill in to the pharmacy. ?

## 2021-10-05 ENCOUNTER — Ambulatory Visit (HOSPITAL_BASED_OUTPATIENT_CLINIC_OR_DEPARTMENT_OTHER): Payer: Medicare Other | Admitting: Family Medicine

## 2021-10-14 ENCOUNTER — Other Ambulatory Visit (HOSPITAL_BASED_OUTPATIENT_CLINIC_OR_DEPARTMENT_OTHER): Payer: Self-pay | Admitting: Family Medicine

## 2021-10-14 DIAGNOSIS — R42 Dizziness and giddiness: Secondary | ICD-10-CM

## 2021-10-20 ENCOUNTER — Encounter (HOSPITAL_BASED_OUTPATIENT_CLINIC_OR_DEPARTMENT_OTHER): Payer: Self-pay | Admitting: Family Medicine

## 2021-10-27 ENCOUNTER — Other Ambulatory Visit (HOSPITAL_BASED_OUTPATIENT_CLINIC_OR_DEPARTMENT_OTHER): Payer: Self-pay | Admitting: Family Medicine

## 2021-10-27 DIAGNOSIS — R42 Dizziness and giddiness: Secondary | ICD-10-CM

## 2021-11-11 ENCOUNTER — Other Ambulatory Visit (HOSPITAL_BASED_OUTPATIENT_CLINIC_OR_DEPARTMENT_OTHER): Payer: Self-pay | Admitting: Family Medicine

## 2021-11-23 ENCOUNTER — Other Ambulatory Visit (HOSPITAL_BASED_OUTPATIENT_CLINIC_OR_DEPARTMENT_OTHER): Payer: Self-pay | Admitting: Family Medicine

## 2022-01-04 ENCOUNTER — Other Ambulatory Visit (HOSPITAL_BASED_OUTPATIENT_CLINIC_OR_DEPARTMENT_OTHER): Payer: Self-pay | Admitting: Family Medicine

## 2022-01-21 ENCOUNTER — Other Ambulatory Visit (HOSPITAL_BASED_OUTPATIENT_CLINIC_OR_DEPARTMENT_OTHER): Payer: Self-pay | Admitting: Family Medicine

## 2022-03-03 IMAGING — CT CT ANGIO CHEST
2 of 6 series · 18 of 36 positions shown · IV contrast (OMNIPAQUE 350)
Comparison: CT Abdomen and Pelvis 1232 hours today.

CLINICAL DATA: 80-year-old female with chest and back pain.

EXAM:
CT ANGIOGRAPHY CHEST WITH CONTRAST
TECHNIQUE: Multidetector CT imaging of the chest was performed using the
standard protocol during bolus administration of intravenous
contrast. Multiplanar CT image reconstructions and MIPs were
obtained to evaluate the vascular anatomy.
CONTRAST:  80mL OMNIPAQUE IOHEXOL 350 MG/ML SOLN

[Series 6: coronal mpr · coronal · 0.50mm/px · 1 of 112 slices shown]
[im 56/112  mediastinal]
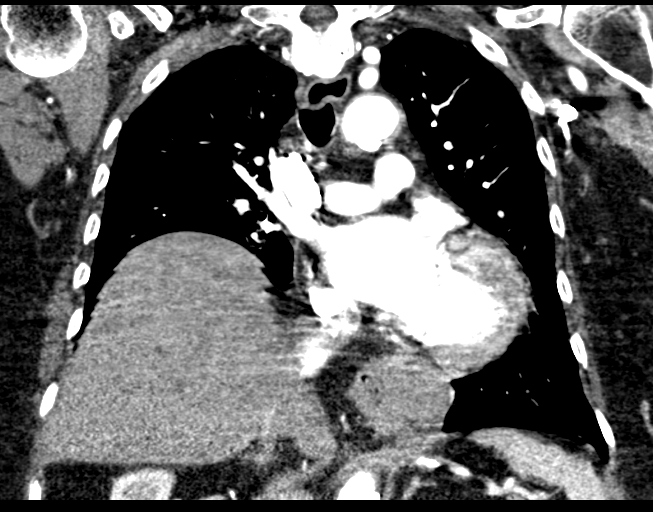

[Series 10: thins · axial · 0.62mm/px · z∈[+1358,+1576]mm · 17 of 246 slices shown]
[im 14/246  lung]
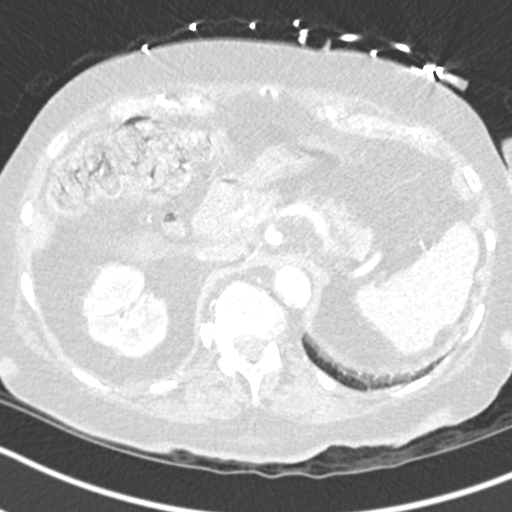
[im 28/246  mediastinal]
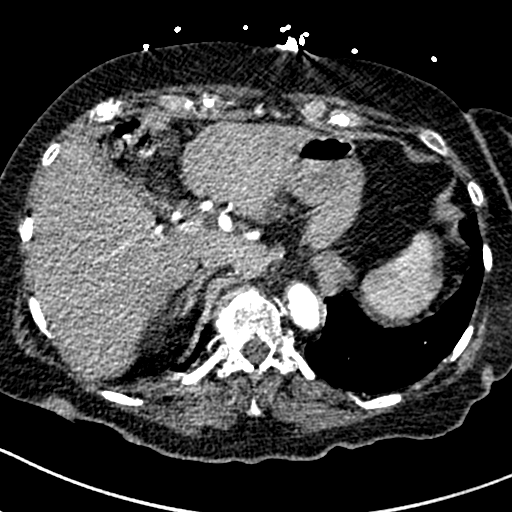
[im 41/246  lung]
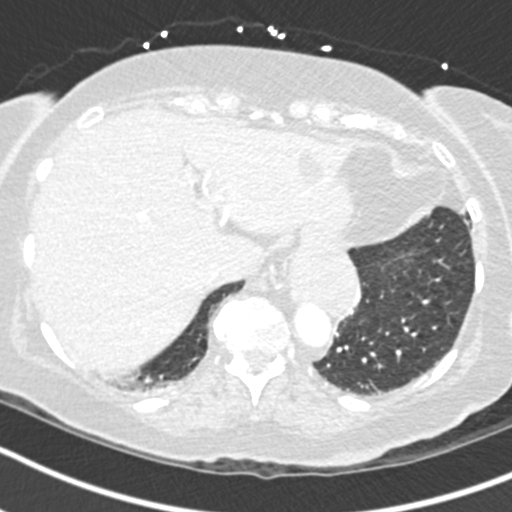
[im 55/246  mediastinal]
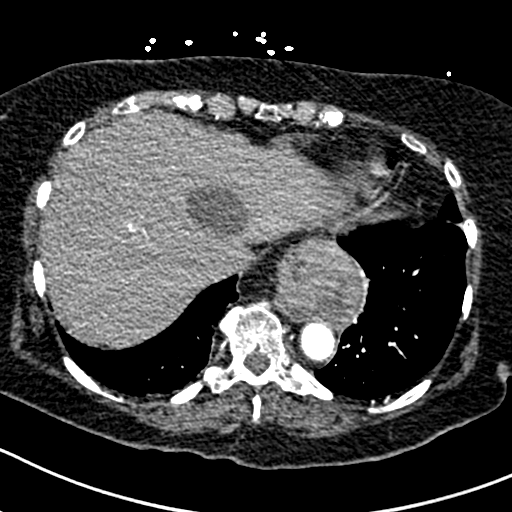
[im 69/246  lung]
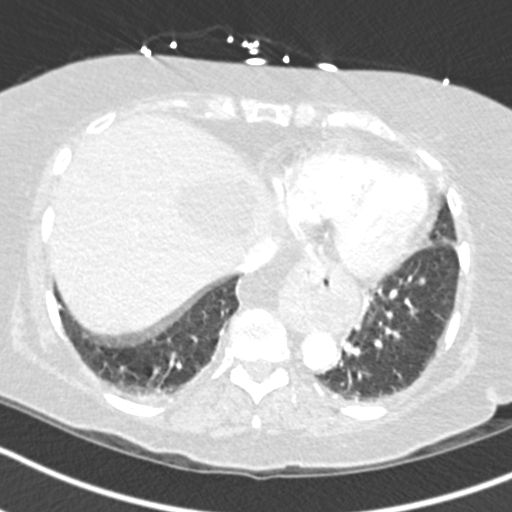
[im 82/246  mediastinal]
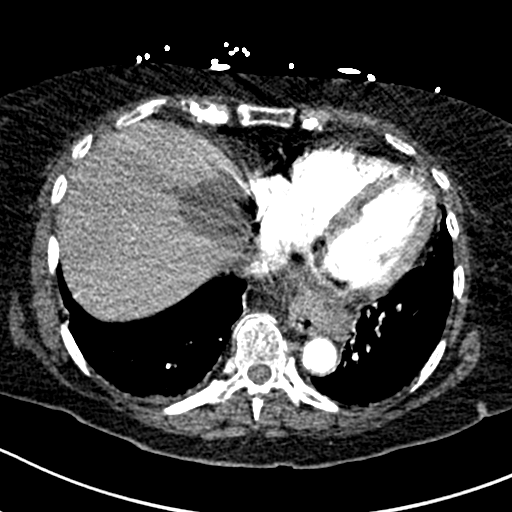
[im 96/246  lung]
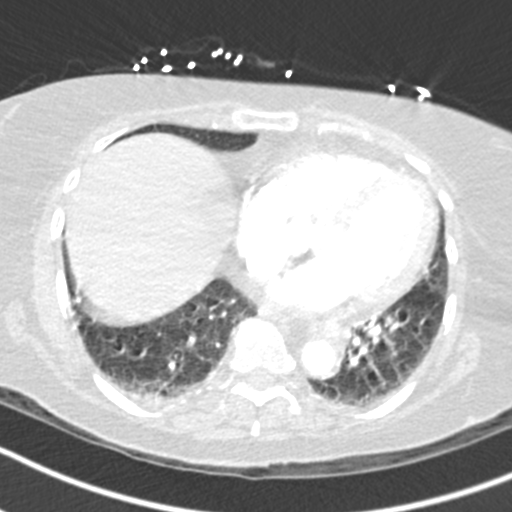
[im 109/246  mediastinal]
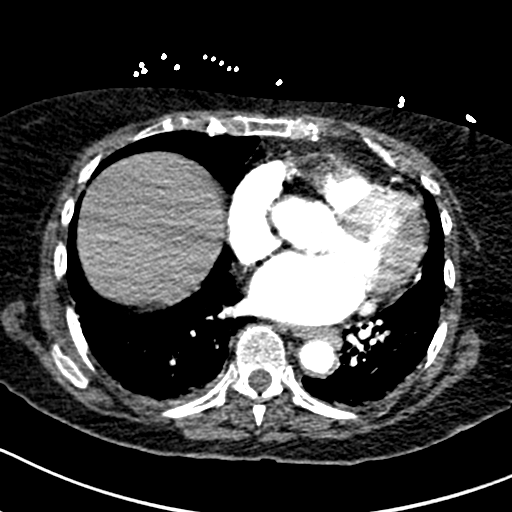
[im 123/246  lung]
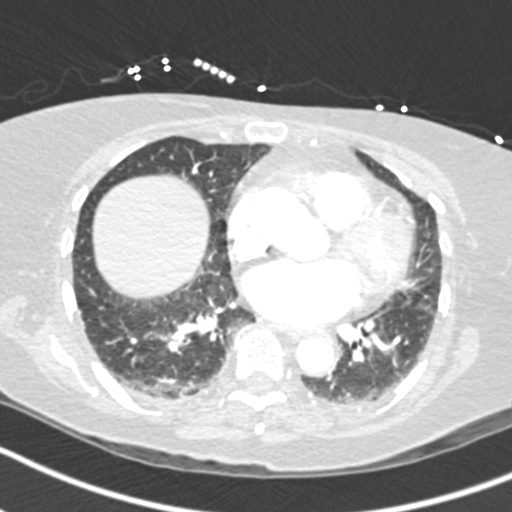
[im 137/246  mediastinal]
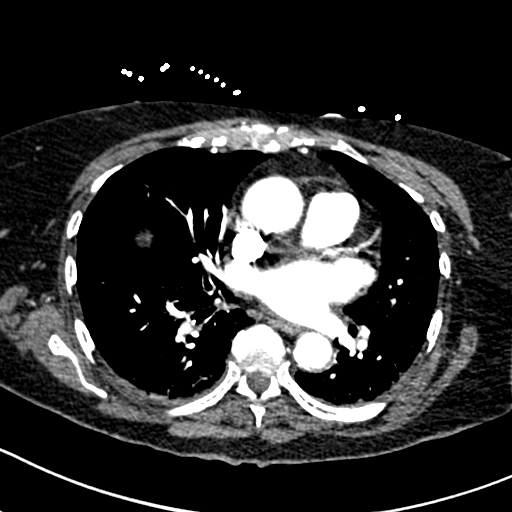
[im 150/246  lung]
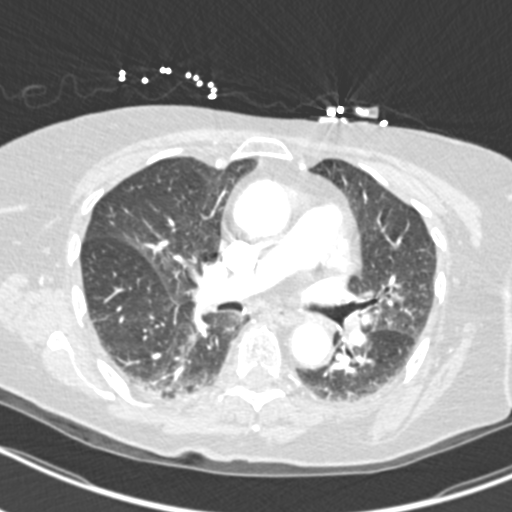
[im 164/246  mediastinal]
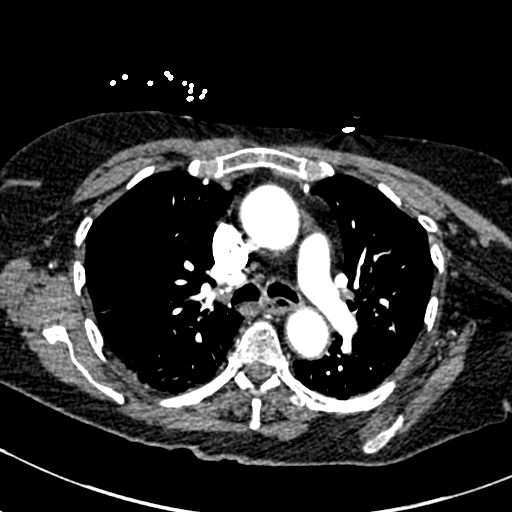
[im 177/246  lung]
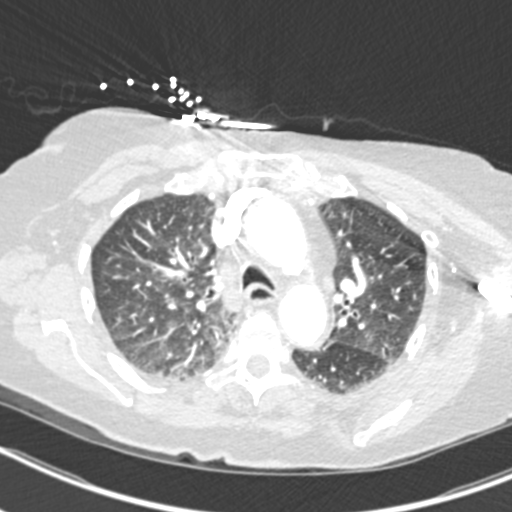
[im 191/246  mediastinal]
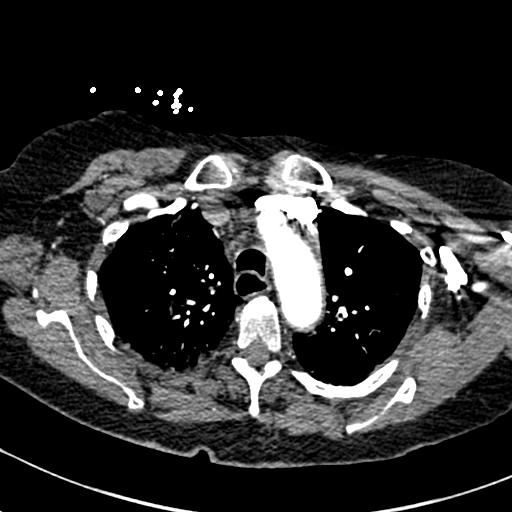
[im 205/246  lung]
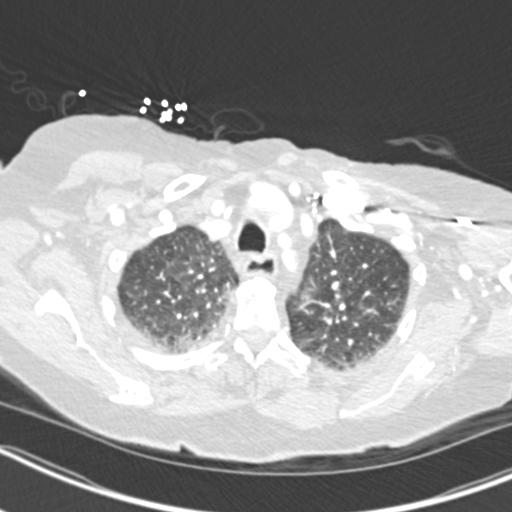
[im 218/246  mediastinal]
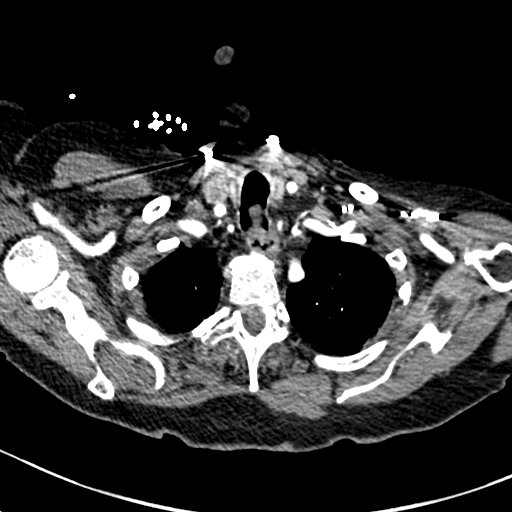
[im 232/246  lung]
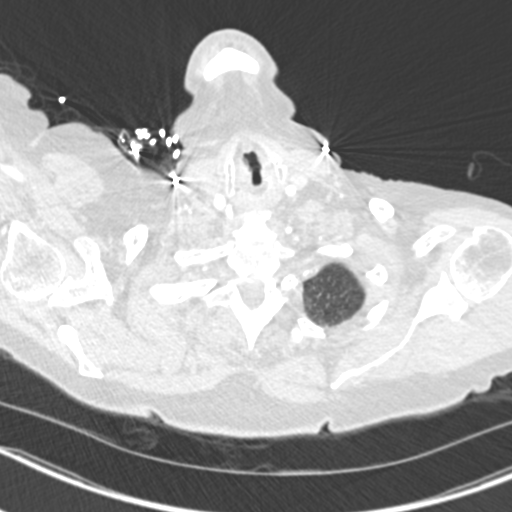

[18 of 36 positions shown; findings below may reference images not displayed]

FINDINGS: Cardiovascular: Good contrast bolus timing in the pulmonary arterial
tree. Respiratory motion in the lower lobes. This degrades detail of
some subsegmental lower lobe branches. But there is no convincing
filling defect identified in the pulmonary arteries to suggest acute
pulmonary embolism.

Moderate aortic atherosclerosis. Negative for thoracic aortic
aneurysm or dissection. Tortuous proximal great vessels. Calcified
coronary artery atherosclerosis. Mild cardiomegaly. No pericardial
effusion.

Mediastinum/Nodes: No mediastinal lymphadenopathy.

Lungs/Pleura: Atelectatic changes to the major airways which remain
patent. Diffuse crowding of lung markings and mild dependent
atelectasis bilaterally. No consolidation. No pleural effusion. No
convincing confluent or inflammatory appearing lung opacity.
Atelectasis also associated with the moderate size gastric hiatal
hernia described earlier today.

Upper Abdomen: Stable to the CT Abdomen and Pelvis at 1232 hours
today.

Musculoskeletal: Degenerative changes in the thoracic spine. No
acute osseous abnormality identified.

Review of the MIP images confirms the above findings.
IMPRESSION: 1. Motion artifact in the lower lobes but no convincing acute
pulmonary embolus.
2. Aortic Atherosclerosis ([id number].M). Negative for thoracic
aortic dissection or aneurysm.
3. Atelectasis, but no other acute pulmonary finding.
4. Calcified coronary artery atherosclerosis and mild cardiomegaly.
5. Moderate size gastric hiatal hernia as described by CT Abdomen
and Pelvis earlier today.

## 2022-03-08 ENCOUNTER — Other Ambulatory Visit (HOSPITAL_BASED_OUTPATIENT_CLINIC_OR_DEPARTMENT_OTHER): Payer: Self-pay | Admitting: Family Medicine

## 2022-03-19 ENCOUNTER — Other Ambulatory Visit (HOSPITAL_BASED_OUTPATIENT_CLINIC_OR_DEPARTMENT_OTHER): Payer: Self-pay | Admitting: Family Medicine

## 2022-06-10 ENCOUNTER — Telehealth (HOSPITAL_BASED_OUTPATIENT_CLINIC_OR_DEPARTMENT_OTHER): Payer: Self-pay | Admitting: Family Medicine

## 2022-06-10 NOTE — Telephone Encounter (Signed)
Left message for patient to call back and schedule Medicare Annual Wellness Visit (AWV) in office.   If not able to come in office, please offer to do virtually or by telephone.  Left office number and my jabber [phone].  AWVI eligible as of:03/24/2021  Please schedule at anytime with Nurse Health Advisor.

## 2022-07-13 ENCOUNTER — Telehealth (HOSPITAL_BASED_OUTPATIENT_CLINIC_OR_DEPARTMENT_OTHER): Payer: Self-pay | Admitting: Family Medicine

## 2022-07-13 NOTE — Telephone Encounter (Signed)
Called patient to schedule Medicare Annual Wellness Visit (AWV). Left message for patient to call back and schedule Medicare Annual Wellness Visit (AWV).  Last date of AWV: AWVI eligible as of 03/24/2021  Please schedule an appointment at any time with [place], [facility].  If any questions, please contact me at [phone].    Thank you,  [place] Direct dial  [phone]

## 2022-07-16 ENCOUNTER — Encounter (HOSPITAL_BASED_OUTPATIENT_CLINIC_OR_DEPARTMENT_OTHER): Payer: Self-pay

## 2022-08-02 ENCOUNTER — Telehealth (HOSPITAL_BASED_OUTPATIENT_CLINIC_OR_DEPARTMENT_OTHER): Payer: Self-pay | Admitting: Family Medicine

## 2022-08-02 NOTE — Telephone Encounter (Signed)
Called patient to schedule Medicare Annual Wellness Visit (AWV). Left message for patient to call back and schedule Medicare Annual Wellness Visit (AWV).  Last date of AWV: AWVI eligible as of 03/24/2021  Please schedule an AWVI appointment at any time with [facility].  If any questions, please contact me at [phone].    Thank you,  [place] Direct dial  [phone]

## 2022-09-09 ENCOUNTER — Other Ambulatory Visit (HOSPITAL_BASED_OUTPATIENT_CLINIC_OR_DEPARTMENT_OTHER): Payer: Self-pay | Admitting: Family Medicine

## 2022-12-18 ENCOUNTER — Other Ambulatory Visit (HOSPITAL_BASED_OUTPATIENT_CLINIC_OR_DEPARTMENT_OTHER): Payer: Self-pay | Admitting: Family Medicine

## 2023-01-23 DEATH — deceased
